# Patient Record
Sex: Male | Born: 1955
Health system: Southern US, Community
[De-identification: ages and names within clinical notes are randomized; demographics above are authoritative.]

## PROBLEM LIST (undated history)

## (undated) DIAGNOSIS — E119 Type 2 diabetes mellitus without complications: Secondary | ICD-10-CM

## (undated) DIAGNOSIS — I2510 Atherosclerotic heart disease of native coronary artery without angina pectoris: Secondary | ICD-10-CM

## (undated) DIAGNOSIS — I219 Acute myocardial infarction, unspecified: Secondary | ICD-10-CM

## (undated) HISTORY — PX: CARDIAC CATHETERIZATION: SHX172

---

## 2006-05-25 HISTORY — PX: CORONARY STENT INTERVENTION: CATH118234

## 2019-09-23 DIAGNOSIS — I4891 Unspecified atrial fibrillation: Secondary | ICD-10-CM

## 2019-09-23 HISTORY — DX: Unspecified atrial fibrillation: I48.91

## 2019-10-05 ENCOUNTER — Inpatient Hospital Stay (HOSPITAL_COMMUNITY)
Admission: EM | Admit: 2019-10-05 | Discharge: 2019-10-11 | DRG: 246 | Disposition: A | Discharge status: Home / Self Care | Payer: Managed Care, Other (non HMO) | Attending: Cardiology | Admitting: Cardiology

## 2019-10-05 ENCOUNTER — Inpatient Hospital Stay (HOSPITAL_COMMUNITY): Payer: Managed Care, Other (non HMO)

## 2019-10-05 ENCOUNTER — Encounter (HOSPITAL_COMMUNITY)
Admission: EM | Disposition: A | Discharge status: Home / Self Care | Payer: Self-pay | Source: Home / Self Care | Attending: Cardiology

## 2019-10-05 ENCOUNTER — Emergency Department (HOSPITAL_COMMUNITY): Payer: Managed Care, Other (non HMO)

## 2019-10-05 ENCOUNTER — Encounter (HOSPITAL_COMMUNITY): Payer: Self-pay | Admitting: *Deleted

## 2019-10-05 ENCOUNTER — Other Ambulatory Visit: Payer: Self-pay

## 2019-10-05 DIAGNOSIS — Z20822 Contact with and (suspected) exposure to covid-19: Secondary | ICD-10-CM | POA: Diagnosis present

## 2019-10-05 DIAGNOSIS — Z6837 Body mass index (BMI) 37.0-37.9, adult: Secondary | ICD-10-CM

## 2019-10-05 DIAGNOSIS — I4819 Other persistent atrial fibrillation: Secondary | ICD-10-CM | POA: Diagnosis present

## 2019-10-05 DIAGNOSIS — N183 Chronic kidney disease, stage 3 unspecified: Secondary | ICD-10-CM | POA: Diagnosis present

## 2019-10-05 DIAGNOSIS — J9601 Acute respiratory failure with hypoxia: Secondary | ICD-10-CM | POA: Diagnosis present

## 2019-10-05 DIAGNOSIS — N179 Acute kidney failure, unspecified: Secondary | ICD-10-CM | POA: Diagnosis present

## 2019-10-05 DIAGNOSIS — J810 Acute pulmonary edema: Secondary | ICD-10-CM | POA: Diagnosis not present

## 2019-10-05 DIAGNOSIS — I255 Ischemic cardiomyopathy: Secondary | ICD-10-CM | POA: Diagnosis present

## 2019-10-05 DIAGNOSIS — I4891 Unspecified atrial fibrillation: Secondary | ICD-10-CM

## 2019-10-05 DIAGNOSIS — I5043 Acute on chronic combined systolic (congestive) and diastolic (congestive) heart failure: Secondary | ICD-10-CM | POA: Diagnosis present

## 2019-10-05 DIAGNOSIS — E785 Hyperlipidemia, unspecified: Secondary | ICD-10-CM | POA: Diagnosis present

## 2019-10-05 DIAGNOSIS — I130 Hypertensive heart and chronic kidney disease with heart failure and stage 1 through stage 4 chronic kidney disease, or unspecified chronic kidney disease: Secondary | ICD-10-CM | POA: Diagnosis present

## 2019-10-05 DIAGNOSIS — E1122 Type 2 diabetes mellitus with diabetic chronic kidney disease: Secondary | ICD-10-CM | POA: Diagnosis present

## 2019-10-05 DIAGNOSIS — I959 Hypotension, unspecified: Secondary | ICD-10-CM | POA: Diagnosis not present

## 2019-10-05 DIAGNOSIS — Z7901 Long term (current) use of anticoagulants: Secondary | ICD-10-CM

## 2019-10-05 DIAGNOSIS — E8729 Other acidosis: Secondary | ICD-10-CM

## 2019-10-05 DIAGNOSIS — I2510 Atherosclerotic heart disease of native coronary artery without angina pectoris: Secondary | ICD-10-CM | POA: Diagnosis present

## 2019-10-05 DIAGNOSIS — T82855A Stenosis of coronary artery stent, initial encounter: Secondary | ICD-10-CM | POA: Diagnosis present

## 2019-10-05 DIAGNOSIS — I214 Non-ST elevation (NSTEMI) myocardial infarction: Secondary | ICD-10-CM | POA: Diagnosis not present

## 2019-10-05 DIAGNOSIS — I272 Pulmonary hypertension, unspecified: Secondary | ICD-10-CM | POA: Diagnosis present

## 2019-10-05 DIAGNOSIS — E669 Obesity, unspecified: Secondary | ICD-10-CM | POA: Diagnosis present

## 2019-10-05 DIAGNOSIS — I472 Ventricular tachycardia: Secondary | ICD-10-CM | POA: Diagnosis not present

## 2019-10-05 DIAGNOSIS — Z955 Presence of coronary angioplasty implant and graft: Secondary | ICD-10-CM

## 2019-10-05 DIAGNOSIS — E1169 Type 2 diabetes mellitus with other specified complication: Secondary | ICD-10-CM | POA: Diagnosis not present

## 2019-10-05 DIAGNOSIS — I10 Essential (primary) hypertension: Secondary | ICD-10-CM

## 2019-10-05 DIAGNOSIS — I5021 Acute systolic (congestive) heart failure: Secondary | ICD-10-CM | POA: Insufficient documentation

## 2019-10-05 DIAGNOSIS — I2119 ST elevation (STEMI) myocardial infarction involving other coronary artery of inferior wall: Secondary | ICD-10-CM | POA: Diagnosis not present

## 2019-10-05 DIAGNOSIS — I2582 Chronic total occlusion of coronary artery: Secondary | ICD-10-CM | POA: Diagnosis present

## 2019-10-05 DIAGNOSIS — I209 Angina pectoris, unspecified: Secondary | ICD-10-CM | POA: Diagnosis not present

## 2019-10-05 DIAGNOSIS — I509 Heart failure, unspecified: Secondary | ICD-10-CM

## 2019-10-05 DIAGNOSIS — J9602 Acute respiratory failure with hypercapnia: Secondary | ICD-10-CM

## 2019-10-05 HISTORY — DX: Atherosclerotic heart disease of native coronary artery without angina pectoris: I25.10

## 2019-10-05 HISTORY — DX: Type 2 diabetes mellitus without complications: E11.9

## 2019-10-05 LAB — CBC WITH DIFFERENTIAL/PLATELET
Abs Immature Granulocytes: 0.1 10*3/uL — ABNORMAL HIGH (ref 0.00–0.07)
Basophils Absolute: 0.1 10*3/uL (ref 0.0–0.1)
Basophils Relative: 1 %
Eosinophils Absolute: 0.2 10*3/uL (ref 0.0–0.5)
Eosinophils Relative: 2 %
HCT: 45.9 % (ref 39.0–52.0)
Hemoglobin: 14.9 g/dL (ref 13.0–17.0)
Immature Granulocytes: 1 %
Lymphocytes Relative: 16 %
Lymphs Abs: 2.4 10*3/uL (ref 0.7–4.0)
MCH: 30.2 pg (ref 26.0–34.0)
MCHC: 32.5 g/dL (ref 30.0–36.0)
MCV: 93.1 fL (ref 80.0–100.0)
Monocytes Absolute: 1.4 10*3/uL — ABNORMAL HIGH (ref 0.1–1.0)
Monocytes Relative: 9 %
Neutro Abs: 10.6 10*3/uL — ABNORMAL HIGH (ref 1.7–7.7)
Neutrophils Relative %: 71 %
Platelets: 226 10*3/uL (ref 150–400)
RBC: 4.93 MIL/uL (ref 4.22–5.81)
RDW: 14 % (ref 11.5–15.5)
WBC: 14.8 10*3/uL — ABNORMAL HIGH (ref 4.0–10.5)
nRBC: 0 % (ref 0.0–0.2)

## 2019-10-05 LAB — POCT I-STAT 7, (LYTES, BLD GAS, ICA,H+H)
Acid-base deficit: 2 mmol/L (ref 0.0–2.0)
Bicarbonate: 26.2 mmol/L (ref 20.0–28.0)
Calcium, Ion: 1.22 mmol/L (ref 1.15–1.40)
HCT: 51 % (ref 39.0–52.0)
Hemoglobin: 17.3 g/dL — ABNORMAL HIGH (ref 13.0–17.0)
O2 Saturation: 98 %
Patient temperature: 98.6
Potassium: 4.5 mmol/L (ref 3.5–5.1)
Sodium: 135 mmol/L (ref 135–145)
TCO2: 28 mmol/L (ref 22–32)
pCO2 arterial: 56.5 mmHg — ABNORMAL HIGH (ref 32.0–48.0)
pH, Arterial: 7.274 — ABNORMAL LOW (ref 7.350–7.450)
pO2, Arterial: 113 mmHg — ABNORMAL HIGH (ref 83.0–108.0)

## 2019-10-05 LAB — GLUCOSE, CAPILLARY
Glucose-Capillary: 140 mg/dL — ABNORMAL HIGH (ref 70–99)
Glucose-Capillary: 124 mg/dL — ABNORMAL HIGH (ref 70–99)
Glucose-Capillary: 154 mg/dL — ABNORMAL HIGH (ref 70–99)

## 2019-10-05 LAB — I-STAT CHEM 8, ED
BUN: 26 mg/dL — ABNORMAL HIGH (ref 8–23)
Calcium, Ion: 1.17 mmol/L (ref 1.15–1.40)
Chloride: 99 mmol/L (ref 98–111)
Creatinine, Ser: 1.1 mg/dL (ref 0.61–1.24)
Glucose, Bld: 206 mg/dL — ABNORMAL HIGH (ref 70–99)
HCT: 47 % (ref 39.0–52.0)
Hemoglobin: 16 g/dL (ref 13.0–17.0)
Potassium: 4.4 mmol/L (ref 3.5–5.1)
Sodium: 138 mmol/L (ref 135–145)
TCO2: 28 mmol/L (ref 22–32)

## 2019-10-05 LAB — COMPREHENSIVE METABOLIC PANEL
ALT: 99 U/L — ABNORMAL HIGH (ref 0–44)
ALT: 99 U/L — ABNORMAL HIGH (ref 0–44)
AST: 71 U/L — ABNORMAL HIGH (ref 15–41)
AST: 66 U/L — ABNORMAL HIGH (ref 15–41)
Albumin: 3.5 g/dL (ref 3.5–5.0)
Albumin: 3.9 g/dL (ref 3.5–5.0)
Alkaline Phosphatase: 73 U/L (ref 38–126)
Alkaline Phosphatase: 81 U/L (ref 38–126)
Anion gap: 11 (ref 5–15)
Anion gap: 16 — ABNORMAL HIGH (ref 5–15)
BUN: 23 mg/dL (ref 8–23)
BUN: 24 mg/dL — ABNORMAL HIGH (ref 8–23)
CO2: 27 mmol/L (ref 22–32)
CO2: 20 mmol/L — ABNORMAL LOW (ref 22–32)
Calcium: 8.9 mg/dL (ref 8.9–10.3)
Calcium: 8.6 mg/dL — ABNORMAL LOW (ref 8.9–10.3)
Chloride: 101 mmol/L (ref 98–111)
Chloride: 101 mmol/L (ref 98–111)
Creatinine, Ser: 1.36 mg/dL — ABNORMAL HIGH (ref 0.61–1.24)
Creatinine, Ser: 1.49 mg/dL — ABNORMAL HIGH (ref 0.61–1.24)
GFR calc Af Amer: >60 mL/min (ref >60)
GFR calc Af Amer: 57 mL/min — ABNORMAL LOW (ref >60)
GFR calc non Af Amer: 55 mL/min — ABNORMAL LOW (ref >60)
GFR calc non Af Amer: 49 mL/min — ABNORMAL LOW (ref >60)
Glucose, Bld: 203 mg/dL — ABNORMAL HIGH (ref 70–99)
Glucose, Bld: 227 mg/dL — ABNORMAL HIGH (ref 70–99)
Potassium: 5.6 mmol/L — ABNORMAL HIGH (ref 3.5–5.1)
Potassium: 4.6 mmol/L (ref 3.5–5.1)
Sodium: 139 mmol/L (ref 135–145)
Sodium: 137 mmol/L (ref 135–145)
Total Bilirubin: 2 mg/dL — ABNORMAL HIGH (ref 0.3–1.2)
Total Bilirubin: 1.6 mg/dL — ABNORMAL HIGH (ref 0.3–1.2)
Total Protein: 6.2 g/dL — ABNORMAL LOW (ref 6.5–8.1)
Total Protein: 6.6 g/dL (ref 6.5–8.1)

## 2019-10-05 LAB — MAGNESIUM: Magnesium: 1.9 mg/dL (ref 1.7–2.4)

## 2019-10-05 LAB — LIPID PANEL
Cholesterol: 101 mg/dL (ref 0–200)
HDL: 33 mg/dL — ABNORMAL LOW (ref >40)
LDL Cholesterol: 58 mg/dL (ref 0–99)
Total CHOL/HDL Ratio: 3.1 RATIO
Triglycerides: 49 mg/dL (ref <150)
VLDL: 10 mg/dL (ref 0–40)

## 2019-10-05 LAB — TSH: TSH: 4.307 u[IU]/mL (ref 0.350–4.500)

## 2019-10-05 LAB — TROPONIN I (HIGH SENSITIVITY)
Troponin I (High Sensitivity): 100 ng/L (ref <18)
Troponin I (High Sensitivity): 803 ng/L (ref <18)
Troponin I (High Sensitivity): 3888 ng/L (ref <18)
Troponin I (High Sensitivity): 24409 ng/L (ref <18)

## 2019-10-05 LAB — CBG MONITORING, ED: Glucose-Capillary: 193 mg/dL — ABNORMAL HIGH (ref 70–99)

## 2019-10-05 LAB — HEMOGLOBIN A1C
Hgb A1c MFr Bld: 7.6 % — ABNORMAL HIGH (ref 4.8–5.6)
Mean Plasma Glucose: 171.42 mg/dL

## 2019-10-05 LAB — APTT: aPTT: 49 seconds — ABNORMAL HIGH (ref 24–36)

## 2019-10-05 LAB — ECHOCARDIOGRAM COMPLETE: Weight: 4224 oz

## 2019-10-05 LAB — SARS CORONAVIRUS 2 BY RT PCR (HOSPITAL ORDER, PERFORMED IN ~~LOC~~ HOSPITAL LAB): SARS Coronavirus 2: NEGATIVE

## 2019-10-05 LAB — BRAIN NATRIURETIC PEPTIDE: B Natriuretic Peptide: 428.3 pg/mL — ABNORMAL HIGH (ref 0.0–100.0)

## 2019-10-05 LAB — PROTIME-INR
INR: 1.2 (ref 0.8–1.2)
Prothrombin Time: 14.7 seconds (ref 11.4–15.2)

## 2019-10-05 LAB — HEPARIN LEVEL (UNFRACTIONATED): Heparin Unfractionated: 0.82 IU/mL — ABNORMAL HIGH (ref 0.30–0.70)

## 2019-10-05 SURGERY — CORONARY/GRAFT ACUTE MI REVASCULARIZATION
Anesthesia: LOCAL

## 2019-10-05 MED ORDER — ACETAMINOPHEN 325 MG PO TABS: 650.0000 mg | ORAL_TABLET | ORAL | Status: DC | PRN

## 2019-10-05 MED ORDER — PERFLUTREN LIPID MICROSPHERE
1.0000 mL | INTRAVENOUS | Status: AC | PRN
  Administered 2019-10-05: 5 mL via INTRAVENOUS
  Filled 2019-10-05: qty 10

## 2019-10-05 MED ORDER — HEPARIN (PORCINE) 25000 UT/250ML-% IV SOLN
1650.0000 [IU]/h | INTRAVENOUS | Status: DC
  Administered 2019-10-05: 1350 [IU]/h via INTRAVENOUS
  Administered 2019-10-06: 1650 [IU]/h via INTRAVENOUS
  Filled 2019-10-05 (×2): qty 250

## 2019-10-05 MED ORDER — FUROSEMIDE 10 MG/ML IJ SOLN: 60.0000 mg | Freq: Once | INTRAMUSCULAR | Status: DC

## 2019-10-05 MED ORDER — INSULIN ASPART 100 UNIT/ML ~~LOC~~ SOLN: 0.0000 [IU] | Freq: Every day | SUBCUTANEOUS | Status: DC

## 2019-10-05 MED ORDER — SODIUM CHLORIDE 0.9% FLUSH: 3.0000 mL | INTRAVENOUS | Status: DC | PRN

## 2019-10-05 MED ORDER — ASPIRIN 81 MG PO CHEW
81.0000 mg | CHEWABLE_TABLET | ORAL | Status: AC
  Administered 2019-10-06: 81 mg via ORAL
  Filled 2019-10-05: qty 1

## 2019-10-05 MED ORDER — ONDANSETRON HCL 4 MG/2ML IJ SOLN: 4.0000 mg | Freq: Four times a day (QID) | INTRAMUSCULAR | Status: DC | PRN

## 2019-10-05 MED ORDER — LORAZEPAM 2 MG/ML IJ SOLN
0.5000 mg | Freq: Once | INTRAMUSCULAR | Status: AC
  Administered 2019-10-05: 0.5 mg via INTRAVENOUS
  Filled 2019-10-05: qty 1

## 2019-10-05 MED ORDER — METOPROLOL TARTRATE 5 MG/5ML IV SOLN
5.0000 mg | Freq: Once | INTRAVENOUS | Status: AC
  Administered 2019-10-05: 5 mg via INTRAVENOUS
  Filled 2019-10-05: qty 5

## 2019-10-05 MED ORDER — LORAZEPAM 2 MG/ML IJ SOLN
0.5000 mg | Freq: Once | INTRAMUSCULAR | Status: AC
  Administered 2019-10-05: 0.5 mg via INTRAVENOUS

## 2019-10-05 MED ORDER — TAMSULOSIN HCL 0.4 MG PO CAPS
0.4000 mg | ORAL_CAPSULE | Freq: Every day | ORAL | Status: DC
  Administered 2019-10-06 – 2019-10-11 (×6): 0.4 mg via ORAL
  Filled 2019-10-05 (×6): qty 1

## 2019-10-05 MED ORDER — LIDOCAINE HCL (PF) 1 % IJ SOLN
INTRAMUSCULAR | Status: AC
  Filled 2019-10-05: qty 30

## 2019-10-05 MED ORDER — PROPOFOL 10 MG/ML IV BOLUS
INTRAVENOUS | Status: AC | PRN
  Administered 2019-10-05: 30 mg via INTRAVENOUS

## 2019-10-05 MED ORDER — SODIUM CHLORIDE 0.9 % IV SOLN: INTRAVENOUS | Status: DC

## 2019-10-05 MED ORDER — NITROGLYCERIN 2 % TD OINT
1.0000 [in_us] | TOPICAL_OINTMENT | Freq: Once | TRANSDERMAL | Status: AC
  Administered 2019-10-05: 1 [in_us] via TOPICAL

## 2019-10-05 MED ORDER — METOPROLOL TARTRATE 25 MG PO TABS
25.0000 mg | ORAL_TABLET | Freq: Four times a day (QID) | ORAL | Status: DC
  Administered 2019-10-05 – 2019-10-06 (×3): 25 mg via ORAL
  Filled 2019-10-05 (×3): qty 1

## 2019-10-05 MED ORDER — SODIUM CHLORIDE 0.9 % IV SOLN: 250.0000 mL | INTRAVENOUS | Status: DC | PRN

## 2019-10-05 MED ORDER — METOPROLOL TARTRATE 25 MG PO TABS
25.0000 mg | ORAL_TABLET | Freq: Once | ORAL | Status: DC
  Filled 2019-10-05: qty 1

## 2019-10-05 MED ORDER — ATORVASTATIN CALCIUM 80 MG PO TABS
80.0000 mg | ORAL_TABLET | Freq: Every day | ORAL | Status: DC
  Administered 2019-10-06 – 2019-10-11 (×6): 80 mg via ORAL
  Filled 2019-10-05 (×6): qty 1

## 2019-10-05 MED ORDER — FUROSEMIDE 10 MG/ML IJ SOLN
40.0000 mg | Freq: Once | INTRAMUSCULAR | Status: AC
  Administered 2019-10-05: 40 mg via INTRAVENOUS
  Filled 2019-10-05: qty 4

## 2019-10-05 MED ORDER — INSULIN ASPART 100 UNIT/ML ~~LOC~~ SOLN
0.0000 [IU] | Freq: Three times a day (TID) | SUBCUTANEOUS | Status: DC
  Administered 2019-10-05 – 2019-10-06 (×2): 2 [IU] via SUBCUTANEOUS
  Administered 2019-10-06: 1 [IU] via SUBCUTANEOUS
  Administered 2019-10-07 – 2019-10-08 (×2): 3 [IU] via SUBCUTANEOUS
  Administered 2019-10-08 – 2019-10-10 (×4): 2 [IU] via SUBCUTANEOUS
  Administered 2019-10-10: 3 [IU] via SUBCUTANEOUS
  Administered 2019-10-11: 2 [IU] via SUBCUTANEOUS

## 2019-10-05 MED ORDER — ASPIRIN EC 81 MG PO TBEC
81.0000 mg | DELAYED_RELEASE_TABLET | Freq: Every day | ORAL | Status: DC
  Administered 2019-10-07 – 2019-10-11 (×4): 81 mg via ORAL
  Filled 2019-10-05 (×4): qty 1

## 2019-10-05 MED ORDER — LISINOPRIL 20 MG PO TABS: 40.0000 mg | ORAL_TABLET | Freq: Every day | ORAL | Status: DC

## 2019-10-05 MED ORDER — PROPOFOL 10 MG/ML IV BOLUS
INTRAVENOUS | Status: AC | PRN
  Administered 2019-10-05: 60 mg via INTRAVENOUS

## 2019-10-05 MED ORDER — FUROSEMIDE 10 MG/ML IJ SOLN
40.0000 mg | Freq: Once | INTRAMUSCULAR | Status: AC
  Administered 2019-10-05: 40 mg via INTRAVENOUS

## 2019-10-05 MED ORDER — CANAGLIFLOZIN 100 MG PO TABS
100.0000 mg | ORAL_TABLET | Freq: Every day | ORAL | Status: DC
  Administered 2019-10-06 – 2019-10-11 (×6): 100 mg via ORAL
  Filled 2019-10-05 (×7): qty 1

## 2019-10-05 MED ORDER — PROPOFOL 10 MG/ML IV BOLUS: 60.0000 mg | Freq: Once | INTRAVENOUS | Status: DC

## 2019-10-05 MED ORDER — PROPOFOL 10 MG/ML IV BOLUS
INTRAVENOUS | Status: AC
  Filled 2019-10-05: qty 20

## 2019-10-05 MED ORDER — SODIUM CHLORIDE 0.9% FLUSH
3.0000 mL | Freq: Two times a day (BID) | INTRAVENOUS | Status: DC
  Administered 2019-10-05 – 2019-10-09 (×5): 3 mL via INTRAVENOUS

## 2019-10-05 MED ORDER — ZOLPIDEM TARTRATE 5 MG PO TABS: 5.0000 mg | ORAL_TABLET | Freq: Every evening | ORAL | Status: DC | PRN

## 2019-10-05 MED ORDER — HEPARIN SODIUM (PORCINE) 1000 UNIT/ML IJ SOLN
INTRAMUSCULAR | Status: AC
  Filled 2019-10-05: qty 1

## 2019-10-05 MED ORDER — NITROGLYCERIN 1 MG/10 ML FOR IR/CATH LAB
INTRA_ARTERIAL | Status: AC
  Filled 2019-10-05: qty 10

## 2019-10-05 MED ORDER — SODIUM CHLORIDE 0.9% FLUSH: 3.0000 mL | Freq: Two times a day (BID) | INTRAVENOUS | Status: DC

## 2019-10-05 MED ORDER — HEPARIN (PORCINE) IN NACL 1000-0.9 UT/500ML-% IV SOLN
INTRAVENOUS | Status: AC
  Filled 2019-10-05: qty 1500

## 2019-10-05 MED ORDER — METOPROLOL SUCCINATE ER 25 MG PO TB24: 50.0000 mg | ORAL_TABLET | Freq: Every day | ORAL | Status: DC

## 2019-10-05 MED ORDER — PROPOFOL 10 MG/ML IV BOLUS: 0.5000 mg/kg | Freq: Once | INTRAVENOUS | Status: DC

## 2019-10-05 MED ORDER — APIXABAN 5 MG PO TABS
5.0000 mg | ORAL_TABLET | Freq: Two times a day (BID) | ORAL | Status: DC
  Filled 2019-10-05 (×2): qty 1

## 2019-10-05 MED ORDER — VERAPAMIL HCL 2.5 MG/ML IV SOLN
INTRAVENOUS | Status: AC
  Filled 2019-10-05: qty 2

## 2019-10-05 MED ORDER — LORAZEPAM 2 MG/ML IJ SOLN
INTRAMUSCULAR | Status: AC
  Filled 2019-10-05: qty 1

## 2019-10-05 MED ORDER — FUROSEMIDE 10 MG/ML IJ SOLN
INTRAMUSCULAR | Status: AC
  Filled 2019-10-05: qty 4

## 2019-10-05 MED ORDER — AMLODIPINE BESYLATE 5 MG PO TABS: 10.0000 mg | ORAL_TABLET | Freq: Every day | ORAL | Status: DC

## 2019-10-05 NOTE — Progress Notes (Signed)
During shift change patient complained of shortness of breath. Patient O2 88% on 6L, placed on 10L HFNC. PA paged. EKG and lasix ordered. Handed off to night RN

## 2019-10-05 NOTE — ED Notes (Signed)
Echo at the bedside °

## 2019-10-05 NOTE — Progress Notes (Addendum)
Progress Note  Patient Name: Carl Little Date of Encounter: 10/05/2019  Primary Cardiologist: Lenward Chancellor, MD  Subjective   No more CP since in SR, wants the BiPAP off  Inpatient Medications    Scheduled Meds: . apixaban  5 mg Oral BID  . aspirin EC  81 mg Oral Daily  . atorvastatin  80 mg Oral Daily  . canagliflozin  100 mg Oral QAC breakfast  . metoprolol tartrate  25 mg Oral Once  . metoprolol tartrate  25 mg Oral Q6H  . sodium chloride flush  3 mL Intravenous Q12H  . tamsulosin  0.4 mg Oral Daily   Continuous Infusions: . sodium chloride     PRN Meds: sodium chloride, acetaminophen, ondansetron (ZOFRAN) IV, perflutren lipid microspheres (DEFINITY) IV suspension, sodium chloride flush, zolpidem   Vital Signs    Vitals:   10/05/19 0700 10/05/19 0715 10/05/19 0730 10/05/19 0745  BP: (!) 142/127 (!) 125/94 122/90 117/80  Pulse: 97 95 (!) 101 95  Resp: 17 18 (!) 21 (!) 21  Temp:      TempSrc:      SpO2: 97% 97% 98% 96%  Weight:        Intake/Output Summary (Last 24 hours) at 10/05/2019 0826 Last data filed at 10/05/2019 0630 Gross per 24 hour  Intake --  Output 1750 ml  Net -1750 ml   Filed Weights   10/05/19 0114  Weight: 119.7 kg   Last Weight  Most recent update: 10/05/2019  1:14 AM   Weight  119.7 kg (264 lb)           Weight change:    Telemetry    SR, ST - Personally Reviewed  ECG    Most recent ECG is ST, HR 110, +artifact, but also w/ ?inf T wave changes - Personally Reviewed  Physical Exam   General: Well developed, well nourished, male, elevated resp rate on BiPAP Head: Normocephalic, atraumatic.  Neck: Supple without bruits, JVD elevated but diff to assess 2nd BiPAP. Lungs:  Resp regular and unlabored, rales bases. Heart: RRR, S1, S2, no S3, S4, or murmur; no rub. Abdomen: Soft, non-tender, non-distended with normoactive bowel sounds. No hepatomegaly. No rebound/guarding. No obvious abdominal masses. Extremities: No  clubbing, cyanosis, 1+ edema. Distal pedal pulses are 2+ bilaterally. Neuro: Alert and oriented X 3. Moves all extremities spontaneously. Psych: Normal affect.  Labs    Hematology Recent Labs  Lab 10/05/19 0108 10/05/19 0143 10/05/19 0254  WBC 14.8*  --   --   RBC 4.93  --   --   HGB 14.9 16.0 17.3*  HCT 45.9 47.0 51.0  MCV 93.1  --   --   MCH 30.2  --   --   MCHC 32.5  --   --   RDW 14.0  --   --   PLT 226  --   --     Chemistry Recent Labs  Lab 10/05/19 0108 10/05/19 0108 10/05/19 0143 10/05/19 0244 10/05/19 0254  NA 139   < > 138 137 135  K 5.6*   < > 4.4 4.6 4.5  CL 101  --  99 101  --   CO2 27  --   --  20*  --   GLUCOSE 203*  --  206* 227*  --   BUN 23  --  26* 24*  --   CREATININE 1.36*  --  1.10 1.49*  --   CALCIUM 8.9  --   --  8.6*  --   PROT 6.2*  --   --  6.6  --   ALBUMIN 3.5  --   --  3.9  --   AST 71*  --   --  66*  --   ALT 99*  --   --  99*  --   ALKPHOS 73  --   --  81  --   BILITOT 2.0*  --   --  1.6*  --   GFRNONAA 55*  --   --  49*  --   GFRAA >60  --   --  57*  --   ANIONGAP 11  --   --  16*  --    < > = values in this interval not displayed.     High Sensitivity Troponin:   Recent Labs  Lab 10/05/19 0108 10/05/19 0244 10/05/19 0541  TROPONINIHS 100* 803* 3,888*      BNP Recent Labs  Lab 10/05/19 0244  BNP 428.3*    Lab Results  Component Value Date   CHOL 101 10/05/2019   HDL 33 (L) 10/05/2019   LDLCALC 58 10/05/2019   TRIG 49 10/05/2019   CHOLHDL 3.1 10/05/2019   Lab Results  Component Value Date   TSH 4.307 10/05/2019   Lab Results  Component Value Date   HGBA1C 7.6 (H) 10/05/2019     Radiology    DG Chest Portable 1 View  Result Date: 10/05/2019 CLINICAL DATA:  Shortness of breath EXAM: PORTABLE CHEST 1 VIEW COMPARISON:  Oct 05, 2019, 1:32 a.m. FINDINGS: Again noted is cardiomegaly. There is prominence of the central pulmonary vasculature. There is new mildly increased interstitial opacity seen within  both lower lungs. No large airspace consolidation or pleural effusion. No acute osseous abnormality. IMPRESSION: Cardiomegaly and slight interval worsening in interstitial edema Electronically Signed   By: Jonna Clark M.D.   On: 10/05/2019 03:01   DG Chest Portable 1 View  Result Date: 10/05/2019 CLINICAL DATA:  Chest pain EXAM: PORTABLE CHEST 1 VIEW COMPARISON:  None. FINDINGS: Moderate cardiomegaly. Pulmonary vascular congestion without overt edema. No pleural effusion or pneumothorax. No focal airspace consolidation. IMPRESSION: Cardiomegaly and pulmonary vascular congestion without overt edema. Electronically Signed   By: Deatra Robinson M.D.   On: 10/05/2019 01:46     Cardiac Studies   ECHO:  Results pending, EF is decreased  Patient Profile     64 y.o. male w/ hx HTN, DM2, DES x 3 RCA & DES x 2 CFX 2008, abnl MV 10/2018 in [place] (med rx), persistent Afib on Eliquis, nl EF on echo 06/2018, was admitted 05/13 w/ CP, CHF & Afib RVR>>DCCV x 2>>SR.  Assessment & Plan    1. NSTEMI - Ez are elevated, pt had CP - EF prev nl, reported as decreased on echo, final report pending - CP has resolved - continue nitro paste, add heparin - continue ASA, high-dose statin, BB - consider cath once resp status improved  2. CHF, likely acute systolic and ?diastolic - BiPAP for now, continue diuresis w/ Lasix 40 mg IV BID - follow renal function w/ diuresis  3. Afib RVR - s/p DCCV x 2 >>SR, ST now - was on Toprol XL 50 mg qd pta, now on Lopressor 25 mg q 6 hr - SBP 110s now, follow on increased BB - CHA2DS2-VASc = 4 (CHF, CAD, DM, HTN) - Eliquis on hold for possible cath in am, heparin ordered   Active Problems:   Atrial fibrillation  with rapid ventricular response ([place])   Acute heart failure ([place])    Signed, Rosaria Ferries , PA-C 8:26 AM 10/05/2019 Pager: [phone]  As above, patient seen and examined.  Patient admitted with atrial fibrillation with rapid ventricular response and  chest pain.  Also with acute dyspnea requiring BiPAP.  He underwent emergent cardioversion.  He was also diuresed.  His symptoms have improved and at present he denies chest pain or dyspnea.  He remains in sinus rhythm.  Follow-up electrocardiogram shows sinus tachycardia and nonspecific ST changes.  Troponin has increased to 24,409.  Creatinine 1.49.   1 non-ST elevation myocardial infarction-plan to treat with aspirin, heparin, beta-blocker and statin.  Patient will require cardiac catheterization.  The risks and benefits including myocardial infarction, CVA and death discussed and he agrees to proceed.  We will plan tomorrow morning.  Apixaban has been placed on hold.  2 paroxysmal atrial fibrillation-patient is status post emergent cardioversion.  Discontinue apixaban given need for catheterization.  We will treat with IV heparin in the interim and resume apixaban once all procedures complete.  We will treat with metoprolol.  Check echocardiogram.  3 acute combined systolic/diastolic congestive heart failure-patient was volume overloaded at time of admission but has improved following emergent cardioversion.  Will hold on further diuresis in anticipation of cardiac catheterization.  4 acute kidney injury-creatinine mildly increased.  Will hold on further diuresis.  Follow creatinine.  Given presentation of congestive heart failure will hold on hydrating prior to catheterization.  5 diabetes mellitus-follow CBGs.  30 min CCT  Kirk Ruths, MD

## 2019-10-05 NOTE — ED Provider Notes (Signed)
CHIEF COMPLAINT: Chest pain, shortness of breath  HPI: 64 year old male with history of CAD, hypertension, diabetes, hyperlipidemia, obesity, atrial fibrillation on Eliquis who presents to the emergency department with complaints of intermittent sharp central chest pain without radiation for the past 2 days.  States symptoms worsened tonight and he became more short of breath.  Shortness of breath worse with lying flat.  He denies any known history of CHF and it does not appear he is on a diuretic.  He is not sure if he was told he had atrial fibrillation or not but states he did have a fast heart rate once and that is why he is on Eliquis.  No history of PE or DVT.  Does have bilateral lower extremity swelling.  States he is here from [place] for work.  No fevers or cough.  Took 324 mg of aspirin at home prior to calling EMS.  Given 1 nitroglycerin tablet with EMS.  EKG is noted to show A. fib with RVR with mild anterior ST elevation likely rate related. Code STEMI initiated in route by EMS.  Not on oxygen chronically.  Placed on oxygen by EMS.  ROS: See HPI Constitutional: no fever  Eyes: no drainage  ENT: no runny nose   Cardiovascular:   chest pain  Resp:  SOB  GI: no vomiting GU: no dysuria Integumentary: no rash  Allergy: no hives  Musculoskeletal:  leg swelling  Neurological: no slurred speech ROS otherwise negative  PAST MEDICAL HISTORY/PAST SURGICAL HISTORY:  Past Medical History:  Diagnosis Date  . CAD (coronary artery disease)   . Diabetes mellitus without complication (HCC)     MEDICATIONS:  Prior to Admission medications   Not on File    ALLERGIES:  No Known Allergies  SOCIAL HISTORY:  Social History   Tobacco Use  . Smoking status: Never Smoker  Substance Use Topics  . Alcohol use: Not on file    FAMILY HISTORY: No family history on file.  EXAM: BP 120/80   Pulse 95   Temp 98.3 F (36.8 C) (Oral)   Resp 19   Wt 119.7 kg   SpO2 96%  CONSTITUTIONAL:  Alert and oriented and responds appropriately to questions.  Obese HEAD: Normocephalic EYES: Conjunctivae clear, pupils appear equal, EOM appear intact ENT: normal nose; moist mucous membranes NECK: Supple, normal ROM CARD: Irregularly irregular and tachycardic; S1 and S2 appreciated; no murmurs, no clicks, no rubs, no gallops RESP: Patient is tachypneic here.  He has hypoxic on room air.  Diminished at bases bilaterally but I do not appreciate rales, rhonchi or wheezing.  He is able to speak full sentences. ABD/GI: Normal bowel sounds; non-distended; soft, non-tender, no rebound, no guarding, no peritoneal signs, no hepatosplenomegaly BACK:  The back appears normal EXT: Normal ROM in all joints; no deformity noted, +1 edema in bilateral lower extremities, no calf tenderness or swelling; no cyanosis SKIN: Normal color for age and race; warm; no rash on exposed skin NEURO: Moves all extremities equally PSYCH: The patient's mood and manner are appropriate.   MEDICAL DECISION MAKING: Patient here with A. fib with RVR.  Suspect this is causing CHF exacerbation.  He appears volume overloaded and is hypoxic here.  EKG reviewed by myself and cardiologist Dr. Blossom Hoops and Dr. Ellyn Hack that are both at bedside.  We agree that patient needs emergent cardioversion given his tachycardia, hypoxia and his symptoms.  Risk and benefits discussed with patient.  He gave verbal permission to proceed with propofol  sedation and electrocardioversion.  He reports compliance with his Eliquis twice daily over the past 30 days.  ED PROGRESS: Cardioversion performed using propofol sedation.  Patient had brief episode of hypoxia that improved with BVM ventilation and airway repositioning.  He was successfully converted into a sinus tachycardia after 2 synchronized shocks using 200 J.  EKG obtained that does not show ST elevation consistent with STEMI.  Code STEMI canceled per Dr. Herbie Baltimore at bedside.  Chest x-ray reviewed and shows  signs of pulmonary edema.  Lasix ordered.  Patient will be admitted by cardiology service.   I was called emergently to the bedside as patient started having respiratory distress.  Nurse reports they were trying to transition him off of nonrebreather onto nasal cannula when he became hypoxic, tachypneic and had a wet cough.  Repeat EKG showed no significant change.  Patient still in sinus tachycardia.  Given another 40 mg of IV Lasix per cardiology recommendations.  An inch of Nitropaste was placed on patient's chest.  Placed on BiPAP and patient seemed to improve.  Also given 0.5 mg of Ativan for anxiety and cough.  ABG obtained that showed mild respiratory acidosis.  Cardiology at bedside performing bedside cardiac ultrasound.  No significant wall motion abnormalities noted or pericardial effusion.  Patient seemed to improve on BiPAP.  Repeat chest x-ray obtained that showed slightly worsening edema.   Patient resting comfortably still on BiPAP.  Awaiting stepdown bed.  Patient's troponins are elevated but this is in the setting of CHF exacerbation and A. fib with RVR.   .Cardioversion  Date/Time: 10/05/2019 1:10 AM Performed by: Davinia Riccardi, Layla Maw, DO Authorized by: Dejanay Wamboldt, Layla Maw, DO   Consent:    Consent obtained:  Verbal   Consent given by:  Patient   Risks discussed:  Cutaneous burn, death, induced arrhythmia and pain   Alternatives discussed:  Rate-control medication and delayed treatment Pre-procedure details:    Cardioversion basis:  Emergent   Rhythm:  Atrial fibrillation   Electrode placement:  Anterior-posterior Patient sedated: Yes. Refer to sedation procedure documentation for details of sedation.  Attempt one:    Cardioversion mode:  Synchronous   Waveform:  Biphasic   Shock (Joules):  200   Shock outcome:  No change in rhythm Attempt two:    Cardioversion mode:  Synchronous   Waveform:  Biphasic   Shock (Joules):  200   Shock outcome:  Conversion to normal sinus  rhythm Post-procedure details:    Patient status:  Awake   Patient tolerance of procedure:  Tolerated well, no immediate complications  .Sedation  Date/Time: 10/05/2019 1:10 AM Performed by: Durant Scibilia, Layla Maw, DO Authorized by: Karol Liendo, Layla Maw, DO   Consent:    Consent obtained:  Verbal and emergent situation   Consent given by:  Patient   Risks discussed:  Allergic reaction, dysrhythmia, inadequate sedation, nausea, prolonged hypoxia resulting in organ damage, prolonged sedation necessitating reversal, respiratory compromise necessitating ventilatory assistance and intubation and vomiting   Alternatives discussed:  Analgesia without sedation, anxiolysis and regional anesthesia Universal protocol:    Procedure explained and questions answered to patient or proxy's satisfaction: yes     Relevant documents present and verified: yes     Test results available and properly labeled: yes     Imaging studies available: yes     Required blood products, implants, devices, and special equipment available: yes     Site/side marked: yes     Immediately prior to procedure a time out was  called: yes     Patient identity confirmation method:  Verbally with patient Indications:    Procedure performed:  Cardioversion   Procedure necessitating sedation performed by:  Physician performing sedation Pre-sedation assessment:    Time since last food or drink:  8pm   ASA classification: class 3 - patient with severe systemic disease     Neck mobility: normal     Mouth opening:  2 finger widths   Thyromental distance:  3 finger widths   Mallampati score:  I - soft palate, uvula, fauces, pillars visible   Pre-sedation assessments completed and reviewed: airway patency, cardiovascular function, hydration status, mental status, nausea/vomiting, pain level, respiratory function and temperature     Pre-sedation assessment completed:  10/05/2019 1:05 AM Immediate pre-procedure details:    Reassessment: Patient  reassessed immediately prior to procedure     Reviewed: vital signs, relevant labs/tests and NPO status     Verified: bag valve mask available, emergency equipment available, intubation equipment available, IV patency confirmed, oxygen available and suction available   Procedure details (see MAR for exact dosages):    Preoxygenation:  Nasal cannula   Sedation:  Propofol   Intended level of sedation: deep   Intra-procedure monitoring:  Blood pressure monitoring, cardiac monitor, continuous pulse oximetry, frequent LOC assessments, frequent vital sign checks and continuous capnometry   Intra-procedure events: hypoxia and respiratory depression     Intra-procedure management:  Airway repositioning and BVM ventilation   Total Provider sedation time (minutes):  30 Post-procedure details:    Post-sedation assessment completed:  10/05/2019 1:45 AM   Attendance: Constant attendance by certified staff until patient recovered     Recovery: Patient returned to pre-procedure baseline     Post-sedation assessments completed and reviewed: airway patency, cardiovascular function, hydration status, mental status, nausea/vomiting, pain level, respiratory function and temperature     Patient is stable for discharge or admission: yes     Patient tolerance:  Tolerated well, no immediate complications    CRITICAL CARE Performed by: Rochele Raring   Total critical care time: 65 minutes  Critical care time was exclusive of separately billable procedures and treating other patients.  Critical care was necessary to treat or prevent imminent or life-threatening deterioration.  Critical care was time spent personally by me on the following activities: development of treatment plan with patient and/or surrogate as well as nursing, discussions with consultants, evaluation of patient's response to treatment, examination of patient, obtaining history from patient or surrogate, ordering and performing treatments and  interventions, ordering and review of laboratory studies, ordering and review of radiographic studies, pulse oximetry and re-evaluation of patient's condition.    EKG Interpretation  Date/Time:  Thursday Oct 05 2019 01:07:12 EDT Ventricular Rate:  153 PR Interval:    QRS Duration: 109 QT Interval:  243 QTC Calculation: 388 R Axis:   73 Text Interpretation: Atrial fibrillation with RVR No old tracing to compare Confirmed by Helvi Royals, Baxter Hire [phone]) on 10/05/2019 5:02:29 AM       EKG Interpretation  Date/Time:  Thursday Oct 05 2019 01:23:41 EDT Ventricular Rate:  100 PR Interval:    QRS Duration: 101 QT Interval:  335 QTC Calculation: 432 R Axis:   66 Text Interpretation: Sinus tachycardia Consider left atrial enlargement Probable anterior infarct, old Baseline wander in lead(s) V2 A fib has resolved Confirmed by Rochele Raring [phone]) on 10/05/2019 5:02:53 AM        EKG Interpretation  Date/Time:  Thursday Oct 05 2019 01:33:41 EDT Ventricular  Rate:  98 PR Interval:    QRS Duration: 97 QT Interval:  355 QTC Calculation: 454 R Axis:   68 Text Interpretation: Sinus tachycardia Atrial premature complex Probable anterior infarct, old Borderline repolarization abnormality Confirmed by Paulena Servais, Baxter Hire [phone]) on 10/05/2019 5:03:25 AM       EKG Interpretation  Date/Time:  Thursday Oct 05 2019 02:26:32 EDT Ventricular Rate:  116 PR Interval:    QRS Duration: 120 QT Interval:  334 QTC Calculation: 464 R Axis:   77 Text Interpretation: Sinus tachycardia Atrial premature complex LAE, consider biatrial enlargement Nonspecific intraventricular conduction delay Anteroseptal infarct, old Nonspecific T abnormalities, inferior leads Baseline wander in lead(s) II aVF V3 Confirmed by Rochele Raring [phone]) on 10/05/2019 5:03:47 AM        EKG Interpretation  Date/Time:  Thursday Oct 05 2019 02:45:31 EDT Ventricular Rate:  110 PR Interval:    QRS Duration: 94 QT Interval:  343 QTC  Calculation: 464 R Axis:   70 Text Interpretation: Sinus tachycardia Probable anterior infarct, old Nonspecific T abnormalities, inferior leads Confirmed by Rochele Raring [phone]) on 10/05/2019 5:04:08 AM        Carl Little was evaluated in Emergency Department on 10/05/2019 for the symptoms described in the history of present illness. He was evaluated in the context of the global COVID-19 pandemic, which necessitated consideration that the patient might be at risk for infection with the SARS-CoV-2 virus that causes COVID-19. Institutional protocols and algorithms that pertain to the evaluation of patients at risk for COVID-19 are in a state of rapid change based on information released by regulatory bodies including the CDC and federal and state organizations. These policies and algorithms were followed during the patient's care in the ED.      Carl Little, Layla Maw, DO 10/05/19 [phone]

## 2019-10-05 NOTE — Progress Notes (Signed)
   10/05/19 2018  Assess: MEWS Score  Temp (!) 97.5 F (36.4 C)  BP 128/82  Pulse Rate (!) 107  Resp (!) 24  SpO2 95 %  O2 Device Bi-PAP  FiO2 (%) 70 %  Assess: MEWS Score  MEWS Temp 0  MEWS Systolic 0  MEWS Pulse 1  MEWS RR 1  MEWS LOC 0  MEWS Score 2  MEWS Score Color Yellow  Assess: if the MEWS score is Yellow or Red  Were vital signs taken at a resting state? Yes  Focused Assessment Documented focused assessment  Early Detection of Sepsis Score *See Row Information* Medium  MEWS guidelines implemented *See Row Information* Yes  Treat  MEWS Interventions Escalated (See documentation below)  Take Vital Signs  Increase Vital Sign Frequency  Yellow: Q 2hr X 2 then Q 4hr X 2, if remains yellow, continue Q 4hrs  Escalate  MEWS: Escalate Yellow: discuss with charge nurse/RN and consider discussing with provider and RRT  Notify: Charge Nurse/RN  Name of Charge Nurse/RN Notified Shanell RN  Date Charge Nurse/RN Notified 10/05/19  Time Charge Nurse/RN Notified 2018  Notify: Provider  Provider Name/Title Judy Pimple PA  Date Provider Notified 10/05/19  Time Provider Notified 2018  Notification Type Page  Notification Reason Other (Comment) (MEWS TURNED YELLOW)  Response See new orders  Date of Provider Response 10/05/19  Time of Provider Response 2018     10/05/19 2018  Assess: MEWS Score  Temp (!) 97.5 F (36.4 C)  BP 128/82  Pulse Rate (!) 107  Resp (!) 24  SpO2 95 %  O2 Device Bi-PAP  FiO2 (%) 70 %  Assess: MEWS Score  MEWS Temp 0  MEWS Systolic 0  MEWS Pulse 1  MEWS RR 1  MEWS LOC 0  MEWS Score 2  MEWS Score Color Yellow  Assess: if the MEWS score is Yellow or Red  Were vital signs taken at a resting state? Yes  Focused Assessment Documented focused assessment  Early Detection of Sepsis Score *See Row Information* Medium  MEWS guidelines implemented *See Row Information* Yes  Treat  MEWS Interventions Escalated (See documentation below)  Take  Vital Signs  Increase Vital Sign Frequency  Yellow: Q 2hr X 2 then Q 4hr X 2, if remains yellow, continue Q 4hrs  Escalate  MEWS: Escalate Yellow: discuss with charge nurse/RN and consider discussing with provider and RRT  Notify: Charge Nurse/RN  Name of Charge Nurse/RN Notified Shanell RN  Date Charge Nurse/RN Notified 10/05/19  Time Charge Nurse/RN Notified 2018  Notify: Provider  Provider Name/Title Judy Pimple PA  Date Provider Notified 10/05/19  Time Provider Notified 2018  Notification Type Page  Notification Reason Other (Comment) (MEWS TURNED YELLOW)  Response See new orders  Date of Provider Response 10/05/19  Time of Provider Response 2018

## 2019-10-05 NOTE — ED Notes (Addendum)
Cards at bedside; pt tolerating bipap

## 2019-10-05 NOTE — ED Notes (Signed)
SDU  bfast ordered  

## 2019-10-05 NOTE — Progress Notes (Addendum)
ANTICOAGULATION CONSULT NOTE - Follow Up Consult  Pharmacy Consult for heparin Indication: chest pain/ACS and atrial fibrillation  No Known Allergies  Patient Measurements: Height: 5' 7.5" (171.5 cm) Weight: 119.7 kg (263 lb 14.3 oz) IBW/kg (Calculated) : 67.25 Heparin Dosing Weight: 94.8 kg  Vital Signs: Temp: 97.5 F (36.4 C) (05/13 2018) Temp Source: Axillary (05/13 2018) BP: 128/82 (05/13 2018) Pulse Rate: 107 (05/13 2018)  Labs: Recent Labs    10/05/19 0108 10/05/19 0108 10/05/19 0136 10/05/19 0143 10/05/19 0244 10/05/19 0254 10/05/19 0541 10/05/19 0729 10/05/19 1828  HGB 14.9   < >  --  16.0  --  17.3*  --   --   --   HCT 45.9  --   --  47.0  --  51.0  --   --   --   PLT 226  --   --   --   --   --   --   --   --   APTT  --   --   --   --   --   --   --   --  49*  LABPROT  --   --  14.7  --   --   --   --   --   --   INR  --   --  1.2  --   --   --   --   --   --   HEPARINUNFRC  --   --   --   --   --   --   --   --  0.82*  CREATININE 1.36*  --   --  1.10 1.49*  --   --   --   --   TROPONINIHS 100*   < >  --   --  803*  --  3,888* 24,409*  --    < > = values in this interval not displayed.    Estimated Creatinine Clearance: 62.6 mL/min (A) (by C-G formula based on SCr of 1.49 mg/dL (H)).   Medical History: Past Medical History:  Diagnosis Date  . Atrial fibrillation (HCC) 09/2019  . CAD (coronary artery disease)   . Diabetes mellitus without complication [facility])     Assessment: 64 yr old male presented to the ED with CP and elevated troponin (NSTEMI). He is on chronic apixaban for history of afib (last dose was yesterday, 10/04/19).  Pt was transitioned to IV heparin for possible cardiac cath. H/H 17.3/51.0, platelets 226. Because pt was recently on apixaban, we will monitor anticoagulation using aPTT until aPTT and heparin levels correlate.  aPTT and heparin level ~8 hrs after starting heparin infusion at 1350 units/hr were 49 sec and 0.82 units/ml,  respectively (not correlated at this point). aPTT is below the goal range for this pt; apixaban is still having effect on heparin level. Per RN, no issues with IV or bleeding observed. Pt scheduled for cardiac cath tomorrow.  Goal of Therapy:  Heparin level 0.3-0.7 units/ml aPTT 66-103 seconds Monitor platelets by anticoagulation protocol: Yes   Plan:  Increase heparin infusion to 1650 units/hr Check 6-hr heparin level and aPTT Monitor daily heparin level, aPTT, CBC Monitor for signs/symptoms of bleeding F/U plans after cardiac cath  Vicki Mallet, PharmD, BCPS, [facility] Clinical Pharmacist 10/05/2019,9:21 PM

## 2019-10-05 NOTE — ED Notes (Signed)
Pt taken off of bipap at this time per his request. Placed pt on 6L [place]. Respirations equal and unlabored. Oxygen saturations maintaining 94% or better. Will cont to monitor.

## 2019-10-05 NOTE — Progress Notes (Signed)
   Notified by RN that patient reporting increased SOB. He reports SOB started about 10 minutes prior to my evaluation. Feels similar to the SOB he experienced prior to onset of chest pain yesterday. No chest pain at this time. He feels more comfortable when he stands up. He was weaned off BiPAP this morning.   Currently on HF [place] at 10L with O2 sats at 93%. HR is elevated to the 100s-110s, though appears to be sinus. He has increased work of breathing on exam with mildly diminished breath sounds at lung bases and 1+ LE edema.   He receive 80mg  IV lasix this morning with plans to hold off on further diuresis today in anticipation of LHC tomorrow, however with increased WOB, will give an additional lasix 40mg  IV at this time. Will check EKG to confirm no dynamic changes. Continue heparin gtt and nitro patch for NSTEMI with plans for [facility] tomorrow. Low threshold to resume BiPAP if unable to maintain O2 sats in the 90s.   , PA-C 10/05/19; 7:37 PM

## 2019-10-05 NOTE — ED Triage Notes (Addendum)
Pt having intermittent CP for the past few days; described as stabbing pain with Sob that worsens when lying flat. Hx of MI and 5 stents placed in 2008,. Pt is on Eliquis.STEMI activated enroute. Pt took 324mg  ASA and has had nitrox 1 with improvement of pain. AFib RVR noted, rate of 140; EMS placed pt on 4L [place]

## 2019-10-05 NOTE — Progress Notes (Signed)
Echocardiogram 2D Echocardiogram has been performed.  Carl Little 10/05/2019, 8:50 AM

## 2019-10-05 NOTE — ED Notes (Signed)
Upon this RN providing update to patient regarding his wait for inpatient bed, patient states "I dont really think I need to be here. " this RN discussed with patient why he is being admitted, pt verbalized understanding.

## 2019-10-05 NOTE — H&P (Addendum)
Cardiology History & Physical    Patient ID: Carl Little MRN: [id number], DOB/AGE: 1955-06-07   Admit date: 10/05/2019  Primary Physician: No primary care provider on file. Primary Cardiologist: No primary care provider on file. - Follows w/ cardiologist in [place]  Patient Profile    64 year old male with history of hypertension, T2DM, obesity, stable coronary artery disease s/p PCI of RCA (3 DES), LCx (2 DES) in 2008, and persistent atrial fibrillation on apixban who was activated as a STEMI after developing acute shortness of breath and mild substernal chest pain at home found to be in acute heart failure with AF w/ RVR at presentation.  History of Present Illness    64 year old male with history of hypertension, T2DM, obesity, stable coronary artery disease s/p PCI of RCA (3 DES), LCx (2 DES) in 2008, and persistent atrial fibrillation on apixban who was activated as a STEMI after developing acute shortness of breath and mild substernal chest pain at home.  En route with EMS there were concerns for inferior dynamic ST elevations.  On arrival to ED he was complaining of 2/10 chest pain with more significant shortness of breath.  He was found to be in atrial fibrillation with rapid ventricular response and chest x-ray demonstrated bilateral pulmonary edema.  He endorsed complete adherence to his home apixban.  He was sedated with assistance of ED attending and DCCV was attempted with persistent AF w/ RVR.  A second DCCV was performed with conversion to normal sinus rhythm.  He required brief bag mask ventilation following this but was able to recover to non-rebreather.  Repeat EKG after conversion to sinus rhythm with improved ventricular rate demonstrated resolving mild ST elevation in inferior leads without ST elevation.  He reports subacute worsening of shortness of breath over the last few days, increasing LE edema, but no significant chest pain.  He laid down tonight and felt markedly  dyspneic.  Denies known prior history of heart failure and does not take diuretics.  He has not been using sympathomimetics.  While he has been on anticoagulation for at least a year and has discussed his AF with his cardiologist, he could not confirm a diagnosis of atrial fibrillation.  Past Medical History   Past Medical History:  Diagnosis Date  . CAD (coronary artery disease)   . Diabetes mellitus without complication [facility])     Past Surgical History:  Procedure Laterality Date  . CARDIAC CATHETERIZATION       Allergies No Known Allergies  Home Medications    Prior to Admission medications   Medication Sig Start Date End Date Taking? Authorizing Provider  amLODipine (NORVASC) 10 MG tablet Take 10 mg by mouth daily. 07/28/19  Yes [provider]  apixaban (ELIQUIS) 5 MG TABS tablet Take 5 mg by mouth in the morning and at bedtime. 07/28/19  Yes [provider]  aspirin 81 MG EC tablet Take 81 mg by mouth daily. 03/29/14  Yes [provider]  atorvastatin (LIPITOR) 80 MG tablet Take 80 mg by mouth every evening.  07/28/19  Yes [provider]  dapagliflozin propanediol (FARXIGA) 5 MG TABS tablet Take 5 mg by mouth daily. 07/28/19  Yes [provider]  Dulaglutide (TRULICITY) 0.75 MG/0.5ML SOPN Inject 0.75 mg into the skin every Monday.  07/28/19  Yes [provider]  lisinopril (ZESTRIL) 40 MG tablet Take 40 mg by mouth daily. 07/28/19  Yes [provider]  metoprolol succinate (TOPROL-XL) 50 MG 24 hr  tablet Take 50 mg by mouth daily. 07/28/19  Yes [provider]  tamsulosin (FLOMAX) 0.4 MG CAPS capsule Take 0.4 mg by mouth daily. 07/28/19  Yes [provider]    Family History    Multiple family members with atherosclerotic heart disease, PAD, none at premature ages.  Social History    Never smoker.  Drinks ~ 6 pack per week, sometimes more.  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  No drug use.  Calls [place] home when he's there occasionally.  Review of Systems    General:  No chills, fever, night sweats or weight changes.  Cardiovascular: chest pain, dyspnea on exertion, edema, orthopnea, palpitations, paroxysmal nocturnal dyspnea. Dermatological: No rash, lesions/masses Respiratory: No cough, dyspnea Urologic: No hematuria, dysuria Abdominal:   No nausea, vomiting, diarrhea, bright red blood per rectum, melena, or hematemesis Neurologic:  No visual changes, wkns, changes in mental status. All other systems reviewed and are otherwise negative except as noted above.  Physical Exam    BP (!) 143/100   Pulse 100   Temp 98.3 F (36.8 C) (Oral)   Resp (!) 21   Wt 119.7 kg   SpO2 96%  General: Alert, NAD HEENT: Normal  Neck: JVP estimated 12 cm H20 Lungs:  Wheezes, lower lung files bilaterally Heart: Regular rhythm, no s3, s4, or murmurs. Abdomen: Soft, non-tender, non-distended, BS +.  Extremities: Warm extremities w/ pitting edema bilaterally. DP/PT/Radials 2+ and equal bilaterally. Psych: Normal affect. Neuro: Alert and oriented. No gross focal deficits. No abnormal movements.  Labs    Pending  iStat w/ Cr 1.10, K 4.4, normal sodium, Glucose 206  Radiology Studies    DG Chest Portable 1 View  Result Date: 10/05/2019 CLINICAL DATA:  Chest pain EXAM: PORTABLE CHEST 1 VIEW COMPARISON:  None. FINDINGS: Moderate cardiomegaly. Pulmonary vascular congestion without overt edema. No pleural effusion or pneumothorax. No focal airspace consolidation. IMPRESSION: Cardiomegaly and pulmonary vascular congestion without overt edema. Electronically Signed   By: Deatra Robinson M.D.   On: 10/05/2019 01:46    ECG & Cardiac Imaging    (from care everywhere, [facility])  10/2018 SPECT: Mild inferolateral ischemia on exercise SPECT myocardial perfusion imaging. The patient had no complaints of chest  pain at a moderate functional capacity of 8.5 METs and 90% maximal predicted heart rate for age.  Overall left ventricular systolic function was normal with no regional wall motion abnormalities. The left ventricular ejection fraction was calculated to be 57%.  Previous study: 2015 and 2017 SPECT studies also demonstrated inferolateral ischemia. Today's study suggests no significant progression of the patient's known coronary artery disease. Mild inferolateral ischemia on exercise SPECT myocardial perfusion imaging. The patient had no complaints of chest pain at a moderate functional capacity of 8.5 METs and 90% maximal predicted heart rate for age.  Overall left ventricular systolic function was normal with no regional wall motion abnormalities. The left ventricular ejection fraction was calculated to be 57%.  Previous study: 2015 and 2017 SPECT studies also demonstrated inferolateral ischemia. Today's study suggests no significant progression of the patient's known coronary artery disease.  TTE 07/22/18 Conclusion: 1. Left ventricular ejection fraction by visual estimation is normal at 55 to 60%. 2. Mild mitral valve regurgitation. 3. Moderate mitral regurgitation and left atrial enlargement noted on previous study.   LHC ~ 2008, transcribed as follows: H/o angiogram at [facility] with 80%  stenosis of the mid RCA in addition to disease of his posterior descending artery. His left anterior descending artery a 50-60% narrowing after the origin of the first septal branch, 50-70% stenosis of a diagonal branch. His left circumflex was completely occluded, thrombus was present. His left ventricular systolic function was normal at that time. He subsequently was brought back for stenting to the right coronary artery and PDA with 3 stents and stenting to be totally occluded left circumflex with 2 stents. 2015 Lexiscan SPECT demonstrates very mild vasodilator-induced ischemia involving the mid and distal  inferolateral segments.   Assessment & Plan   64 year old male with history of HTN, diabetes not on insulin, obesity, persistent AF per chart review on apixaban, CAD s/p PCI DES RCA, LCx remotely with non-obstructive LAD disease presenting with acute heart failure syndrome and AF w/ RVR now s/p DCCV.  It's unclear at this time whether AF w/ RVR or worsening heart failure syndrome provoked his AF with subsequent decompensation.  EKG shows non-specific ST changes inferiorly after conversion to sinus rhythm that improved with rate control.  Will require admission for IV diuresis, echocardiogram to exclude systolic heart failure, and exclusion of acute coronary syndrome.   Problem list Atrial fibrillation with rapid ventricular response Acute heart failure syndrome Acute hypoxemic respiratory failure History of CAD s/p PCI to RCA, LCx 2008 Hypertension Diabetes Obesity  Plan Atrial fibrillation with rapid ventricular response, CV4, favor rhythm control strategy. - Continue home apixaban 5 mg BID - S/p 5 mg IV metoprolol in ED.  Increase from home metoprolol succinate 50 mg to 100 mg daily - Consider AAD if recurrent AF here despite volume optimization. - TSH, TTE as below.  Acute heart failure syndrome - Lasix 80 mg IV in ED and reevaluate response. - Complete TTE in AM - Titrate metoprolol as above - Continue home lisinopril 40 mg daily - Continue home dapagloflozin  Acute hypoxemic respiratory failure - Management of heart failure as above - Exclude COVID19 - Bronchodilators as tolerated; no clear evidence pneumonia and stably anticoagulated so low suspicion for pulmonary embolism.  History of CAD s/p PCI to RCA, LCx 2008 - Continue home apixban, aspirin per outside cardiologist - Continue atorvastatin 80 mg daily  Hypertension - Continue metoprolol, lisinopril as above. - Hold home amlodipine while uptitrating metoprolol.  Diabetes - Canagloflozin 100 mg daily to substitute  empagloflozin - SSI  Obesity - Lifestyle modification  Nutrition: Regular diet DVT ppx: Therapeutically anticoagulated Advanced Care Planning: Full code.   Signed, Delight Hoh, MD 10/05/2019, 2:23 AM  Addended for cosign functionality.  Following initial evaluation patient had recurrent respiratory decompensation despite maintenance of sinus rhythm.  Placed on continuous BiPAP and upgraded to inpatient admission on stepdown.  Nitroglycerin paste applied and adjunctive diuretic administered.  Continue metoprolol.  Home antihypertensives (amlodipine, lisinopril) held pending clinical stability.  Good urine output with improving respiratory status on reevaluation.  Mild troponin elevation noted, but suspect demand ischemia in setting of tachyarrhythmia, subendocardial ischemia; repeat pending.

## 2019-10-05 NOTE — Progress Notes (Signed)
ANTICOAGULATION CONSULT NOTE - Initial Consult  Pharmacy Consult for heparin Indication: chest pain/ACS and atrial fibrillation  No Known Allergies  Patient Measurements: Height: 5' 7.5" (171.5 cm) Weight: 119.7 kg (263 lb 14.3 oz) IBW/kg (Calculated) : 67.25 Heparin Dosing Weight: 94.8kg  Vital Signs: Temp: 98.3 F (36.8 C) (05/13 0154) Temp Source: Oral (05/13 0154) BP: 117/92 (05/13 0832) Pulse Rate: 95 (05/13 0745)  Labs: Recent Labs    10/05/19 0108 10/05/19 0108 10/05/19 0136 10/05/19 0143 10/05/19 0244 10/05/19 0254 10/05/19 0541  HGB 14.9   < >  --  16.0  --  17.3*  --   HCT 45.9  --   --  47.0  --  51.0  --   PLT 226  --   --   --   --   --   --   LABPROT  --   --  14.7  --   --   --   --   INR  --   --  1.2  --   --   --   --   CREATININE 1.36*  --   --  1.10 1.49*  --   --   TROPONINIHS 100*  --   --   --  803*  --  3,888*   < > = values in this interval not displayed.    Estimated Creatinine Clearance: 62.6 mL/min (A) (by C-G formula based on SCr of 1.49 mg/dL (H)).   Medical History: Past Medical History:  Diagnosis Date  . CAD (coronary artery disease)   . Diabetes mellitus without complication (HCC)     Medications:  Infusions:  . sodium chloride    . heparin      Assessment: 52 yom presented to the ED with CP. He is on chronic apixaban for history of afib. Troponin is elevated. To transition to IV heparin for possible cardiac cath. CBC is WNL. No bleeding noted. Last dose of apixaban was yesterday 5/12.   Goal of Therapy:  Heparin level 0.3-0.7 units/ml aPTT 66-103 seconds Monitor platelets by anticoagulation protocol: Yes   Plan:  Heparin gtt 1350 units/hr Check an 8 hr heparin level and aPTT Daily heparin level, aPTT and CBC  Carl Little, Drake Leach 10/05/2019,9:06 AM

## 2019-10-05 NOTE — Progress Notes (Signed)
Inpatient Diabetes Program Recommendations  AACE/ADA: New Consensus Statement on Inpatient Glycemic Control (2015)  Target Ranges:  Prepandial:   less than 140 mg/dL      Peak postprandial:   less than 180 mg/dL (1-2 hours)      Critically ill patients:  140 - 180 mg/dL   Lab Results  Component Value Date   GLUCAP 193 (H) 10/05/2019   HGBA1C 7.6 (H) 10/05/2019    Review of Glycemic Control Results for Carl Little, Carl Little (MRN [id number]) as of 10/05/2019 10:58  Ref. Range 10/05/2019 01:38  Glucose-Capillary Latest Ref Range: 70 - 99 mg/dL 311 (H)   Diabetes history: Type 2 DM Outpatient Diabetes medications: Trulicity 0.75 mg Qwk Current orders for Inpatient glycemic control: Invokana 100 mg QAM  Inpatient Diabetes Program Recommendations:    Noted CBGs yesterday while ED and addition of Invokana.   Also consider: -Adding Novolog 0-9 units TID & HS and associated CBGs.   Thanks, Lujean Rave, MSN, RNC-OB Diabetes Coordinator [phone] (8a-5p)

## 2019-10-06 ENCOUNTER — Encounter (HOSPITAL_COMMUNITY)
Admission: EM | Disposition: A | Discharge status: Home / Self Care | Payer: Self-pay | Source: Home / Self Care | Attending: Cardiology

## 2019-10-06 DIAGNOSIS — J810 Acute pulmonary edema: Secondary | ICD-10-CM | POA: Insufficient documentation

## 2019-10-06 DIAGNOSIS — I2510 Atherosclerotic heart disease of native coronary artery without angina pectoris: Secondary | ICD-10-CM

## 2019-10-06 DIAGNOSIS — I5043 Acute on chronic combined systolic (congestive) and diastolic (congestive) heart failure: Secondary | ICD-10-CM

## 2019-10-06 HISTORY — PX: RIGHT/LEFT HEART CATH AND CORONARY ANGIOGRAPHY: CATH118266

## 2019-10-06 LAB — POCT I-STAT EG7
Acid-Base Excess: 6 mmol/L — ABNORMAL HIGH (ref 0.0–2.0)
Bicarbonate: 33.7 mmol/L — ABNORMAL HIGH (ref 20.0–28.0)
Calcium, Ion: 1.22 mmol/L (ref 1.15–1.40)
HCT: 45 % (ref 39.0–52.0)
Hemoglobin: 15.3 g/dL (ref 13.0–17.0)
O2 Saturation: 62 %
Potassium: 4.2 mmol/L (ref 3.5–5.1)
Sodium: 139 mmol/L (ref 135–145)
TCO2: 35 mmol/L — ABNORMAL HIGH (ref 22–32)
pCO2, Ven: 57.9 mmHg (ref 44.0–60.0)
pH, Ven: 7.374 (ref 7.250–7.430)
pO2, Ven: 34 mmHg (ref 32.0–45.0)

## 2019-10-06 LAB — CBC
HCT: 45.9 % (ref 39.0–52.0)
Hemoglobin: 14.9 g/dL (ref 13.0–17.0)
MCH: 30.4 pg (ref 26.0–34.0)
MCHC: 32.5 g/dL (ref 30.0–36.0)
MCV: 93.7 fL (ref 80.0–100.0)
Platelets: 159 10*3/uL (ref 150–400)
RBC: 4.9 MIL/uL (ref 4.22–5.81)
RDW: 14.2 % (ref 11.5–15.5)
WBC: 13.6 10*3/uL — ABNORMAL HIGH (ref 4.0–10.5)
nRBC: 0 % (ref 0.0–0.2)

## 2019-10-06 LAB — APTT: aPTT: 77 seconds — ABNORMAL HIGH (ref 24–36)

## 2019-10-06 LAB — BASIC METABOLIC PANEL
Anion gap: 11 (ref 5–15)
BUN: 22 mg/dL (ref 8–23)
CO2: 27 mmol/L (ref 22–32)
Calcium: 8.6 mg/dL — ABNORMAL LOW (ref 8.9–10.3)
Chloride: 99 mmol/L (ref 98–111)
Creatinine, Ser: 1.08 mg/dL (ref 0.61–1.24)
GFR calc Af Amer: >60 mL/min (ref >60)
GFR calc non Af Amer: >60 mL/min (ref >60)
Glucose, Bld: 155 mg/dL — ABNORMAL HIGH (ref 70–99)
Potassium: 4.3 mmol/L (ref 3.5–5.1)
Sodium: 137 mmol/L (ref 135–145)

## 2019-10-06 LAB — POCT I-STAT 7, (LYTES, BLD GAS, ICA,H+H)
Acid-Base Excess: 6 mmol/L — ABNORMAL HIGH (ref 0.0–2.0)
Bicarbonate: 32.2 mmol/L — ABNORMAL HIGH (ref 20.0–28.0)
Calcium, Ion: 1.21 mmol/L (ref 1.15–1.40)
HCT: 45 % (ref 39.0–52.0)
Hemoglobin: 15.3 g/dL (ref 13.0–17.0)
O2 Saturation: 97 %
Potassium: 4.2 mmol/L (ref 3.5–5.1)
Sodium: 139 mmol/L (ref 135–145)
TCO2: 34 mmol/L — ABNORMAL HIGH (ref 22–32)
pCO2 arterial: 49.8 mmHg — ABNORMAL HIGH (ref 32.0–48.0)
pH, Arterial: 7.418 (ref 7.350–7.450)
pO2, Arterial: 86 mmHg (ref 83.0–108.0)

## 2019-10-06 LAB — GLUCOSE, CAPILLARY
Glucose-Capillary: 136 mg/dL — ABNORMAL HIGH (ref 70–99)
Glucose-Capillary: 131 mg/dL — ABNORMAL HIGH (ref 70–99)
Glucose-Capillary: 141 mg/dL — ABNORMAL HIGH (ref 70–99)
Glucose-Capillary: 94 mg/dL (ref 70–99)

## 2019-10-06 LAB — HEPARIN LEVEL (UNFRACTIONATED): Heparin Unfractionated: 0.74 IU/mL — ABNORMAL HIGH (ref 0.30–0.70)

## 2019-10-06 SURGERY — RIGHT/LEFT HEART CATH AND CORONARY ANGIOGRAPHY
Anesthesia: LOCAL

## 2019-10-06 MED ORDER — HEPARIN SODIUM (PORCINE) 1000 UNIT/ML IJ SOLN
INTRAMUSCULAR | Status: AC
  Filled 2019-10-06: qty 1

## 2019-10-06 MED ORDER — METOPROLOL TARTRATE 50 MG PO TABS
50.0000 mg | ORAL_TABLET | Freq: Two times a day (BID) | ORAL | Status: DC
  Administered 2019-10-06 – 2019-10-07 (×2): 50 mg via ORAL
  Filled 2019-10-06 (×2): qty 1

## 2019-10-06 MED ORDER — SODIUM CHLORIDE 0.9% FLUSH: 3.0000 mL | INTRAVENOUS | Status: DC | PRN

## 2019-10-06 MED ORDER — FUROSEMIDE 10 MG/ML IJ SOLN: 40.0000 mg | Freq: Once | INTRAMUSCULAR | Status: AC

## 2019-10-06 MED ORDER — IOHEXOL 350 MG/ML SOLN
INTRAVENOUS | Status: DC | PRN
  Administered 2019-10-06: 125 mL

## 2019-10-06 MED ORDER — ONDANSETRON HCL 4 MG/2ML IJ SOLN: 4.0000 mg | Freq: Four times a day (QID) | INTRAMUSCULAR | Status: DC | PRN

## 2019-10-06 MED ORDER — FUROSEMIDE 10 MG/ML IJ SOLN
INTRAMUSCULAR | Status: DC | PRN
  Administered 2019-10-06: 40 mg via INTRAVENOUS

## 2019-10-06 MED ORDER — CHLORHEXIDINE GLUCONATE 0.12 % MT SOLN
15.0000 mL | Freq: Two times a day (BID) | OROMUCOSAL | Status: DC
  Administered 2019-10-06 – 2019-10-11 (×7): 15 mL via OROMUCOSAL
  Filled 2019-10-06 (×7): qty 15

## 2019-10-06 MED ORDER — HYDRALAZINE HCL 20 MG/ML IJ SOLN: 10.0000 mg | INTRAMUSCULAR | Status: AC | PRN

## 2019-10-06 MED ORDER — VERAPAMIL HCL 2.5 MG/ML IV SOLN
INTRAVENOUS | Status: AC
  Filled 2019-10-06: qty 2

## 2019-10-06 MED ORDER — LIDOCAINE HCL (PF) 1 % IJ SOLN
INTRAMUSCULAR | Status: DC | PRN
  Administered 2019-10-06 (×2): 3 mL

## 2019-10-06 MED ORDER — HEPARIN SODIUM (PORCINE) 1000 UNIT/ML IJ SOLN
INTRAMUSCULAR | Status: DC | PRN
  Administered 2019-10-06: 6000 [IU] via INTRAVENOUS

## 2019-10-06 MED ORDER — SODIUM CHLORIDE 0.9% FLUSH
3.0000 mL | Freq: Two times a day (BID) | INTRAVENOUS | Status: DC
  Administered 2019-10-06 – 2019-10-07 (×2): 3 mL via INTRAVENOUS

## 2019-10-06 MED ORDER — VERAPAMIL HCL 2.5 MG/ML IV SOLN
INTRAVENOUS | Status: DC | PRN
  Administered 2019-10-06: 10 mL via INTRA_ARTERIAL

## 2019-10-06 MED ORDER — HEPARIN (PORCINE) IN NACL 1000-0.9 UT/500ML-% IV SOLN
INTRAVENOUS | Status: DC | PRN
  Administered 2019-10-06 (×2): 500 mL

## 2019-10-06 MED ORDER — ORAL CARE MOUTH RINSE
15.0000 mL | Freq: Two times a day (BID) | OROMUCOSAL | Status: DC
  Administered 2019-10-06 – 2019-10-09 (×4): 15 mL via OROMUCOSAL

## 2019-10-06 MED ORDER — LABETALOL HCL 5 MG/ML IV SOLN: 10.0000 mg | INTRAVENOUS | Status: AC | PRN

## 2019-10-06 MED ORDER — FUROSEMIDE 10 MG/ML IJ SOLN
INTRAMUSCULAR | Status: AC
  Filled 2019-10-06: qty 4

## 2019-10-06 MED ORDER — LIDOCAINE HCL (PF) 1 % IJ SOLN
INTRAMUSCULAR | Status: AC
  Filled 2019-10-06: qty 30

## 2019-10-06 MED ORDER — HEPARIN (PORCINE) 25000 UT/250ML-% IV SOLN
1350.0000 [IU]/h | INTRAVENOUS | Status: DC
  Administered 2019-10-06: 1650 [IU]/h via INTRAVENOUS
  Administered 2019-10-07: 1550 [IU]/h via INTRAVENOUS
  Filled 2019-10-06 (×4): qty 250

## 2019-10-06 MED ORDER — ACETAMINOPHEN 325 MG PO TABS: 650.0000 mg | ORAL_TABLET | ORAL | Status: DC | PRN

## 2019-10-06 MED ORDER — HEPARIN (PORCINE) IN NACL 1000-0.9 UT/500ML-% IV SOLN
INTRAVENOUS | Status: AC
  Filled 2019-10-06: qty 1000

## 2019-10-06 MED ORDER — SODIUM CHLORIDE 0.9 % IV SOLN: 250.0000 mL | INTRAVENOUS | Status: DC | PRN

## 2019-10-06 SURGICAL SUPPLY — 13 items
CATH 5FR JL3.5 JR4 ANG PIG MP (CATHETERS) ×2 IMPLANT
CATH BALLN WEDGE 5F 110CM (CATHETERS) ×2 IMPLANT
CATH INFINITI 5 FR 3DRC (CATHETERS) ×2 IMPLANT
DEVICE RAD COMP TR BAND LRG (VASCULAR PRODUCTS) ×2 IMPLANT
GLIDESHEATH SLEND A-KIT 6F 22G (SHEATH) ×2 IMPLANT
GUIDEWIRE INQWIRE 1.5J.035X260 (WIRE) ×1 IMPLANT
INQWIRE 1.5J .035X260CM (WIRE) ×2
KIT HEART LEFT (KITS) ×2 IMPLANT
PACK CARDIAC CATHETERIZATION (CUSTOM PROCEDURE TRAY) ×2 IMPLANT
SHEATH GLIDE SLENDER 4/5FR (SHEATH) ×2 IMPLANT
SHEATH PROBE COVER 6X72 (BAG) ×2 IMPLANT
TRANSDUCER W/STOPCOCK (MISCELLANEOUS) ×2 IMPLANT
TUBING CIL FLEX 10 FLL-RA (TUBING) ×2 IMPLANT

## 2019-10-06 NOTE — Progress Notes (Signed)
PA- Judy Pimple  came to see pt. & assessed & ordered to give Lasix & to put him back to Bipap .

## 2019-10-06 NOTE — Care Management (Signed)
1315 10-06-19 Case Manager received consult for Entresto Cost. Case Manager submitted benefits check and will follow for cost. Case Manager will provide patient with 30 day free and co pay card. Gala Lewandowsky, RN,BSN Case Manager

## 2019-10-06 NOTE — Progress Notes (Signed)
 Progress Note  Patient Name: Carl Little Date of Encounter: 10/06/2019  Primary Cardiologist: Florentina Marquart, MD   Subjective   Dyspneic last PM but improved this AM; no CP  Inpatient Medications    Scheduled Meds: . aspirin EC  81 mg Oral Daily  . atorvastatin  80 mg Oral Daily  . canagliflozin  100 mg Oral QAC breakfast  . insulin aspart  0-15 Units Subcutaneous TID WC  . insulin aspart  0-5 Units Subcutaneous QHS  . metoprolol tartrate  25 mg Oral Once  . metoprolol tartrate  25 mg Oral Q6H  . sodium chloride flush  3 mL Intravenous Q12H  . sodium chloride flush  3 mL Intravenous Q12H  . tamsulosin  0.4 mg Oral Daily   Continuous Infusions: . sodium chloride    . sodium chloride    . sodium chloride 30 mL/hr at 10/06/19 0615  . heparin 1,650 Units/hr (10/06/19 0312)   PRN Meds: sodium chloride, sodium chloride, acetaminophen, ondansetron (ZOFRAN) IV, sodium chloride flush, sodium chloride flush, zolpidem   Vital Signs    Vitals:   10/06/19 0018 10/06/19 0405 10/06/19 0752 10/06/19 0812  BP: 110/89 118/79 112/80   Pulse: 89  82 (P) 91  Resp: 17 16 17 (P) 16  Temp: 98.5 F (36.9 C) 97.6 F (36.4 C) 98.2 F (36.8 C)   TempSrc: Axillary Axillary Axillary   SpO2: 94%     Weight:      Height:        Intake/Output Summary (Last 24 hours) at 10/06/2019 0812 Last data filed at 10/06/2019 0110 Gross per 24 hour  Intake 506.18 ml  Output 1400 ml  Net -893.82 ml   Last 3 Weights 10/05/2019 10/05/2019  Weight (lbs) 263 lb 14.3 oz 264 lb  Weight (kg) 119.7 kg 119.75 kg      Telemetry    Sinus with 3 beats NSVT and brief PAT- Personally Reviewed  Physical Exam   GEN: No acute distress.   Neck: No JVD Cardiac: RRR Respiratory: Diminished BS bases GI: Soft, nontender, non-distended  MS: trace edema Neuro:  Nonfocal  Psych: Normal affect   Labs    High Sensitivity Troponin:   Recent Labs  Lab 10/05/19 0108 10/05/19 0244 10/05/19 0541  10/05/19 0729  TROPONINIHS 100* 803* 3,888* 24,409*      Chemistry Recent Labs  Lab 10/05/19 0108 10/05/19 0108 10/05/19 0143 10/05/19 0143 10/05/19 0244 10/05/19 0254 10/06/19 0450  NA 139   < > 138   < > 137 135 137  K 5.6*   < > 4.4   < > 4.6 4.5 4.3  CL 101   < > 99  --  101  --  99  CO2 27  --   --   --  20*  --  27  GLUCOSE 203*   < > 206*  --  227*  --  155*  BUN 23   < > 26*  --  24*  --  22  CREATININE 1.36*   < > 1.10  --  1.49*  --  1.08  CALCIUM 8.9  --   --   --  8.6*  --  8.6*  PROT 6.2*  --   --   --  6.6  --   --   ALBUMIN 3.5  --   --   --  3.9  --   --   AST 71*  --   --   --  66*  --   --     ALT 99*  --   --   --  99*  --   --   ALKPHOS 73  --   --   --  81  --   --   BILITOT 2.0*  --   --   --  1.6*  --   --   GFRNONAA 55*  --   --   --  49*  --  >60  GFRAA >60  --   --   --  57*  --  >60  ANIONGAP 11  --   --   --  16*  --  11   < > = values in this interval not displayed.     Hematology Recent Labs  Lab 10/05/19 0108 10/05/19 0108 10/05/19 0143 10/05/19 0254 10/06/19 0450  WBC 14.8*  --   --   --  13.6*  RBC 4.93  --   --   --  4.90  HGB 14.9   < > 16.0 17.3* 14.9  HCT 45.9   < > 47.0 51.0 45.9  MCV 93.1  --   --   --  93.7  MCH 30.2  --   --   --  30.4  MCHC 32.5  --   --   --  32.5  RDW 14.0  --   --   --  14.2  PLT 226  --   --   --  159   < > = values in this interval not displayed.    BNP Recent Labs  Lab 10/05/19 0244  BNP 428.3*     Radiology    DG Chest Portable 1 View  Result Date: 10/05/2019 CLINICAL DATA:  Shortness of breath EXAM: PORTABLE CHEST 1 VIEW COMPARISON:  Oct 05, 2019, 1:32 a.m. FINDINGS: Again noted is cardiomegaly. There is prominence of the central pulmonary vasculature. There is new mildly increased interstitial opacity seen within both lower lungs. No large airspace consolidation or pleural effusion. No acute osseous abnormality. IMPRESSION: Cardiomegaly and slight interval worsening in interstitial  edema Electronically Signed   By: Bindu  Avutu M.D.   On: 10/05/2019 03:01   DG Chest Portable 1 View  Result Date: 10/05/2019 CLINICAL DATA:  Chest pain EXAM: PORTABLE CHEST 1 VIEW COMPARISON:  None. FINDINGS: Moderate cardiomegaly. Pulmonary vascular congestion without overt edema. No pleural effusion or pneumothorax. No focal airspace consolidation. IMPRESSION: Cardiomegaly and pulmonary vascular congestion without overt edema. Electronically Signed   By: Kevin  Herman M.D.   On: 10/05/2019 01:46   ECHOCARDIOGRAM COMPLETE  Result Date: 10/05/2019    ECHOCARDIOGRAM REPORT   Patient Name:   Kerri Spieler Date of Exam: 10/05/2019 Medical Rec #:  [id number]     Height:       68.0 in Accession #:    [id number]    Weight:       264.0 lb Date of Birth:  06/28/1955      BSA:          2.300 m Patient Age:    63 years      BP:           119/87 mmHg Patient Gender: M             HR:           94 bpm. Exam Location:  Inpatient Procedure: 2D Echo, Color Doppler, Cardiac Doppler and Intracardiac            Opacification Agent Indications:    I48.91* Unspecified   atrial fibrillation  History:        Patient has prior history of Echocardiogram examinations, most                 recent 07/22/2018. CAD, Arrythmias:Atrial Fibrillation; Risk                 Factors:Hypertension, Diabetes and Dyslipidemia. Prior performed                 in [place].  Sonographer:    Emily Senior RDCS Referring Phys: [id number] CARRIEL T NIPP  Sonographer Comments: Technically difficult study due to poor echo windows. Performed supine on BiPap. IMPRESSIONS  1. Left ventricular ejection fraction, by estimation, is 30 to 35%. The left ventricle has moderately decreased function. The left ventricle demonstrates global hypokinesis. The left ventricular internal cavity size was mildly dilated. Left ventricular diastolic parameters are indeterminate.  2. Right ventricular systolic function is normal. The right ventricular size is mildly enlarged. There is  normal pulmonary artery systolic pressure.  3. Left atrial size was severely dilated.  4. Right atrial size was mildly dilated.  5. The mitral valve is grossly normal. Trivial mitral valve regurgitation. No evidence of mitral stenosis.  6. The aortic valve is grossly normal. Aortic valve regurgitation is not visualized. No aortic stenosis is present.  7. The inferior vena cava is dilated in size with >50% respiratory variability, suggesting right atrial pressure of 8 mmHg. Comparison(s): No access to prior study for comparison. FINDINGS  Left Ventricle: Left ventricular ejection fraction, by estimation, is 30 to 35%. The left ventricle has moderately decreased function. The left ventricle demonstrates global hypokinesis. Definity contrast agent was given IV to delineate the left ventricular endocardial borders. The left ventricular internal cavity size was mildly dilated. There is no left ventricular hypertrophy. Left ventricular diastolic parameters are indeterminate. Right Ventricle: The right ventricular size is mildly enlarged. No increase in right ventricular wall thickness. Right ventricular systolic function is normal. There is normal pulmonary artery systolic pressure. The tricuspid regurgitant velocity is 2.30  m/s, and with an assumed right atrial pressure of 8 mmHg, the estimated right ventricular systolic pressure is 29.2 mmHg. Left Atrium: Left atrial size was severely dilated. Right Atrium: Right atrial size was mildly dilated. Pericardium: There is no evidence of pericardial effusion. Mitral Valve: The mitral valve is grossly normal. Trivial mitral valve regurgitation. No evidence of mitral valve stenosis. Tricuspid Valve: The tricuspid valve is normal in structure. Tricuspid valve regurgitation is trivial. No evidence of tricuspid stenosis. Aortic Valve: The aortic valve is grossly normal.. There is mild thickening and mild calcification of the aortic valve. Aortic valve regurgitation is not  visualized. No aortic stenosis is present. There is mild thickening of the aortic valve. There is mild calcification of the aortic valve. Pulmonic Valve: The pulmonic valve was not well visualized. Pulmonic valve regurgitation is not visualized. No evidence of pulmonic stenosis. Aorta: The aortic root and ascending aorta are structurally normal, with no evidence of dilitation. Venous: The inferior vena cava is dilated in size with greater than 50% respiratory variability, suggesting right atrial pressure of 8 mmHg. IAS/Shunts: No atrial level shunt detected by color flow Doppler.  LEFT VENTRICLE PLAX 2D LVIDd:         6.00 cm LVIDs:         5.10 cm LV PW:         0.80 cm LV IVS:        0.70 cm LVOT diam:       2.10 cm LV SV:         47 LV SV Index:   20 LVOT Area:     3.46 cm  RIGHT VENTRICLE RV S prime:     10.10 cm/s TAPSE (M-mode): 1.8 cm LEFT ATRIUM              Index       RIGHT ATRIUM           Index LA diam:        5.00 cm  2.17 cm/m  RA Area:     21.40 cm LA Vol (A2C):   97.1 ml  42.22 ml/m RA Volume:   63.10 ml  27.44 ml/m LA Vol (A4C):   131.5 ml 57.18 ml/m LA Biplane Vol: 125.0 ml 54.35 ml/m  AORTIC VALVE LVOT Vmax:   76.70 cm/s LVOT Vmean:  51.600 cm/s LVOT VTI:    0.136 m  AORTA Ao Root diam: 3.20 cm Ao Asc diam:  3.60 cm TRICUSPID VALVE TR Peak grad:   21.2 mmHg TR Vmax:        230.00 cm/s  SHUNTS Systemic VTI:  0.14 m Systemic Diam: 2.10 cm Bridgette Christopher MD Electronically signed by Bridgette Christopher MD Signature Date/Time: 10/05/2019/11:32:01 AM    Final     Patient Profile     63 y.o. male with past medical history of coronary artery disease, diabetes mellitus, atrial fibrillation admitted with atrial fibrillation with rapid ventricular response, acute pulmonary edema and chest pain.  Patient cardioverted emergently in the emergency room.  He has ruled in for myocardial infarction.  Echocardiogram shows ejection fraction 30 to 35%, mild right ventricular enlargement, biatrial  enlargement.  Assessment & Plan    1 paroxysmal atrial fibrillation-patient underwent emergent cardioversion in the emergency room with improvement in his symptoms.  He remains in sinus rhythm this morning.  Continue metoprolol.  He is on IV heparin in anticipation of cardiac catheterization.  This will need to be resumed following procedure given recent cardioversion.  Will need to transition to apixaban once all procedures complete.  2 non-ST elevation myocardial infarction-patient is pain-free today.  Plan to proceed with cardiac catheterization.  The risks and benefits including myocardial infarction, CVA and death discussed and he agrees to proceed.  Plan to continue aspirin, beta-blocker and statin.  Continue heparin.  3 acute combined systolic/diastolic congestive heart failure-patient mildly dyspneic this morning.  We will give 1 dose of Lasix 40 mg IV x1.  Follow renal function closely.  We will not hydrate prior to catheterization.  4 cardiomyopathy-likely ischemic in etiology.  Will change Lopressor to 50 mg twice daily and transition to Toprol at discharge.  Will add ARB following catheterization once renal function stable and if blood pressure allows.  5 diabetes mellitus-follow CBGs.  For questions or updates, please contact [facility] Please consult [email or web address] for contact info under        Signed, Tavionna Grout, MD  10/06/2019, 8:12 AM    

## 2019-10-06 NOTE — Progress Notes (Signed)
Heart Failure Stewardship Pharmacist Progress Note   PCP: System, Pcp Not In PCP-Cardiologist: Olga Millers, MD    HPI:  64 yo M with PMH significant for CAD (s/p PCI of RCA and LCx), diabetes mellitus, and atrial fibrillation admitted on 5/13 for afib with RVR, acute pulmonary edema, and chest pain.   He was then cardioverted x 2 in the ED to sinus rhythm with improved ventricular rate.  ECHO on 10/05/19 with newly reduced LV dysfunction - LVEF 30-35%.  He has been receiving IV diuretics for volume overload during admission.  Right and left heart catheterization completed 10/06/19. Found to have severe native vessel disease with moderate distal in-stent restenosis in RCA. LCx occluded with poor collaterals. No interventions/PCI completed pending further discussion with interventional +/- surgical team. Moderate pulmonary hypertension: RA 15, PA 44, PCWP 34, LVEDP 27, CO/CI 4.32/1.88.   Current HF Medications: Furosemide 40 mg IV x 1 Metoprolol 50 mg BID Invokana 100 mg (substitiution for [place])  Prior to admission HF Medications: Metoprolol succinate 50 mg daily Lisinopril 40 mg daily Farxiga 5 mg daily  Pertinent Lab Values: . Serum creatinine 1.08, BUN 22, Potassium 4.3, Sodium 137, BNP 428.3   Vital Signs: . Weight: 263 lbs (estimated dry weight: unkown) . Blood pressure: 110/80s  . Heart rate: 80-90s   Medication Assistance / Insurance Benefits Check: Does the patient have prescription insurance?   yes Type of insurance plan: commercial - [employer]  Does the patient qualify for medication assistance through manufacturers or grants?   yes  Eligible grants and/or patient assistance programs: pending  Medication assistance applications in progress: None  Medication assistance applications approved: None  Approved medication assistance renewals will be completed by: Dr. Ludwig Clarks office  Outpatient Pharmacy:  [facility]  [facility]armacy Is the patient willing to use [facility] TOC pharmacy at discharge?   TBD Is the patient willing to transition their outpatient pharmacy to utilize a [facility] outpatient pharmacy?   TBD    Assessment: 1. Acute systolic CHF (EF 43-15%), possible ischemic in etiology. L/RHC completed today with severe native vessel disease, in-stent restenosis, and moderate pulmonary hypertension. NYHA class II symptoms. Volume overloaded on MD exam. - Furosemide 40 mg IV x 1 - follow up with MD for additional orders given cath findings today - Continue metoprolol 50 mg BID - consider transitioning back to metoprolol succinate prior to discharge - Consider starting Entresto 24/26 mg BID - last dose of lisinopril was 10/04/19. - Consider adding spironolactone prior to discharge - Continue Invokana (substitution for [place]).    Plan: 1) Medication changes recommended at this time: - None - will hold off adding Entresto at this time due to recent contrast use today and possible PCI next week.  2) Patient assistance application(s): - None  3)  Education  - To be completed prior to discharge  Danae Orleans, PharmD, BCPS Heart Failure Stewardship Pharmacist Phone [phone] 10/06/2019       10:12 AM

## 2019-10-06 NOTE — Progress Notes (Addendum)
ANTICOAGULATION CONSULT NOTE - Follow Up Consult  Pharmacy Consult for heparin Indication: chest pain/ACS and atrial fibrillation  Assessment: 64 yr old male presented to the ED with CP and elevated troponin (NSTEMI). He is on chronic apixaban for history of afib (last dose was yesterday, 10/04/19).  He is now s/p cardiac cath with in-stent restenosis and plans for staged cath next week. Pharmacy to restart heparin 8 hours post sheath removal  Goal of Therapy:  Heparin level 0.3-0.7 units/ml aPTT 66-103 seconds Monitor platelets by anticoagulation protocol: Yes   Plan:  -resume heparin at 1650 units/hr at 7:30pm -Daily aPTT, HL/CBC  Thanks for allowing pharmacy to be a part of this patient's care.  Harland German, PharmD Clinical Pharmacist **Pharmacist phone directory can now be found on [email or web address] ([email or web address]).  Listed under [facility].

## 2019-10-06 NOTE — Care Management (Signed)
Per  Josephina Shih W/[facility] ph# [phone]  Co-pay amount for Entresto 24/26 mg bid for a 30 day supply $40.00. at a retail pharmacy. Mail order Pharmacy :[facility] for a 90 day supply $100.00.  No PA required Deductible met. Tier 2 [facility]: [facility].

## 2019-10-06 NOTE — Interval H&P Note (Signed)
History and Physical Interval Note:  10/06/2019 10:29 AM  High risk with possibility of AKI. Borderline CHF compensation.Cath Lab Visit (complete for each Cath Lab visit)  Clinical Evaluation Leading to the Procedure:   ACS: Yes.    Non-ACS:    Anginal Classification: CCS IV  Anti-ischemic medical therapy: Maximal Therapy (2 or more classes of medications)  Non-Invasive Test Results: No non-invasive testing performed  Prior CABG: No previous CABG        Carl Little  has presented today for surgery, with the diagnosis of nstemi.  The various methods of treatment have been discussed with the patient and family. After consideration of risks, benefits and other options for treatment, the patient has consented to  Procedure(s): RIGHT/LEFT HEART CATH AND CORONARY ANGIOGRAPHY (N/A) as a surgical intervention.  The patient's history has been reviewed, patient examined, no change in status, stable for surgery.  I have reviewed the patient's chart and labs.  Questions were answered to the patient's satisfaction.     Lyn Records III

## 2019-10-06 NOTE — Progress Notes (Signed)
ANTICOAGULATION CONSULT NOTE - Follow Up Consult  Pharmacy Consult for heparin Indication: chest pain/ACS and atrial fibrillation  Assessment: 64 yr old male presented to the ED with CP and elevated troponin (NSTEMI). He is on chronic apixaban for history of afib (last dose was yesterday, 10/04/19).  Pt was transitioned to IV heparin for possible cardiac cath. H/H 17.3/51.0, platelets 226. Because pt was recently on apixaban, we will monitor anticoagulation using aPTT until aPTT and heparin levels correlate.  APTT 77 sec, in goal range Goal of Therapy:  Heparin level 0.3-0.7 units/ml aPTT 66-103 seconds Monitor platelets by anticoagulation protocol: Yes   Plan:  Continue heparin infusion at 1650 units/hr Monitor daily heparin level, aPTT, CBC Monitor for signs/symptoms of bleeding F/U plans after cardiac cath  Thanks for allowing pharmacy to be a part of this patient's care.  Talbert Cage, PharmD Clinical Pharmacist  10/06/2019,6:12 AM

## 2019-10-06 NOTE — Progress Notes (Signed)
Pt.had >9 secs.SVT as per CCMD .  MD on call Truman Hayward made aware

## 2019-10-06 NOTE — H&P (View-Only) (Signed)
Progress Note  Patient Name: Carl Little Date of Encounter: 10/06/2019  Primary Cardiologist: Kirk Ruths, MD   Subjective   Dyspneic last PM but improved this AM; no CP  Inpatient Medications    Scheduled Meds: . aspirin EC  81 mg Oral Daily  . atorvastatin  80 mg Oral Daily  . canagliflozin  100 mg Oral QAC breakfast  . insulin aspart  0-15 Units Subcutaneous TID WC  . insulin aspart  0-5 Units Subcutaneous QHS  . metoprolol tartrate  25 mg Oral Once  . metoprolol tartrate  25 mg Oral Q6H  . sodium chloride flush  3 mL Intravenous Q12H  . sodium chloride flush  3 mL Intravenous Q12H  . tamsulosin  0.4 mg Oral Daily   Continuous Infusions: . sodium chloride    . sodium chloride    . sodium chloride 30 mL/hr at 10/06/19 0615  . heparin 1,650 Units/hr (10/06/19 0312)   PRN Meds: sodium chloride, sodium chloride, acetaminophen, ondansetron (ZOFRAN) IV, sodium chloride flush, sodium chloride flush, zolpidem   Vital Signs    Vitals:   10/06/19 0018 10/06/19 0405 10/06/19 0752 10/06/19 0812  BP: 110/89 118/79 112/80   Pulse: 89  82 (P) 91  Resp: 17 16 17  (P) 16  Temp: 98.5 F (36.9 C) 97.6 F (36.4 C) 98.2 F (36.8 C)   TempSrc: Axillary Axillary Axillary   SpO2: 94%     Weight:      Height:        Intake/Output Summary (Last 24 hours) at 10/06/2019 0812 Last data filed at 10/06/2019 0110 Gross per 24 hour  Intake 506.18 ml  Output 1400 ml  Net -893.82 ml   Last 3 Weights 10/05/2019 10/05/2019  Weight (lbs) 263 lb 14.3 oz 264 lb  Weight (kg) 119.7 kg 119.75 kg      Telemetry    Sinus with 3 beats NSVT and brief PAT- Personally Reviewed  Physical Exam   GEN: No acute distress.   Neck: No JVD Cardiac: RRR Respiratory: Diminished BS bases GI: Soft, nontender, non-distended  MS: trace edema Neuro:  Nonfocal  Psych: Normal affect   Labs    High Sensitivity Troponin:   Recent Labs  Lab 10/05/19 0108 10/05/19 0244 10/05/19 0541  10/05/19 0729  TROPONINIHS 100* 803* 3,888* 24,409*      Chemistry Recent Labs  Lab 10/05/19 0108 10/05/19 0108 10/05/19 0143 10/05/19 0143 10/05/19 0244 10/05/19 0254 10/06/19 0450  NA 139   < > 138   < > 137 135 137  K 5.6*   < > 4.4   < > 4.6 4.5 4.3  CL 101   < > 99  --  101  --  99  CO2 27  --   --   --  20*  --  27  GLUCOSE 203*   < > 206*  --  227*  --  155*  BUN 23   < > 26*  --  24*  --  22  CREATININE 1.36*   < > 1.10  --  1.49*  --  1.08  CALCIUM 8.9  --   --   --  8.6*  --  8.6*  PROT 6.2*  --   --   --  6.6  --   --   ALBUMIN 3.5  --   --   --  3.9  --   --   AST 71*  --   --   --  66*  --   --  ALT 99*  --   --   --  99*  --   --   ALKPHOS 73  --   --   --  81  --   --   BILITOT 2.0*  --   --   --  1.6*  --   --   GFRNONAA 55*  --   --   --  49*  --  >60  GFRAA >60  --   --   --  57*  --  >60  ANIONGAP 11  --   --   --  16*  --  11   < > = values in this interval not displayed.     Hematology Recent Labs  Lab 10/05/19 0108 10/05/19 0108 10/05/19 0143 10/05/19 0254 10/06/19 0450  WBC 14.8*  --   --   --  13.6*  RBC 4.93  --   --   --  4.90  HGB 14.9   < > 16.0 17.3* 14.9  HCT 45.9   < > 47.0 51.0 45.9  MCV 93.1  --   --   --  93.7  MCH 30.2  --   --   --  30.4  MCHC 32.5  --   --   --  32.5  RDW 14.0  --   --   --  14.2  PLT 226  --   --   --  159   < > = values in this interval not displayed.    BNP Recent Labs  Lab 10/05/19 0244  BNP 428.3*     Radiology    DG Chest Portable 1 View  Result Date: 10/05/2019 CLINICAL DATA:  Shortness of breath EXAM: PORTABLE CHEST 1 VIEW COMPARISON:  Oct 05, 2019, 1:32 a.m. FINDINGS: Again noted is cardiomegaly. There is prominence of the central pulmonary vasculature. There is new mildly increased interstitial opacity seen within both lower lungs. No large airspace consolidation or pleural effusion. No acute osseous abnormality. IMPRESSION: Cardiomegaly and slight interval worsening in interstitial  edema Electronically Signed   By: Jonna Clark M.D.   On: 10/05/2019 03:01   DG Chest Portable 1 View  Result Date: 10/05/2019 CLINICAL DATA:  Chest pain EXAM: PORTABLE CHEST 1 VIEW COMPARISON:  None. FINDINGS: Moderate cardiomegaly. Pulmonary vascular congestion without overt edema. No pleural effusion or pneumothorax. No focal airspace consolidation. IMPRESSION: Cardiomegaly and pulmonary vascular congestion without overt edema. Electronically Signed   By: Deatra Robinson M.D.   On: 10/05/2019 01:46   ECHOCARDIOGRAM COMPLETE  Result Date: 10/05/2019    ECHOCARDIOGRAM REPORT   Patient Name:   Carl Little Date of Exam: 10/05/2019 Medical Rec #:  [id number]     Height:       68.0 in Accession #:    [id number]    Weight:       264.0 lb Date of Birth:  26-May-1955      BSA:          2.300 m Patient Age:    63 years      BP:           119/87 mmHg Patient Gender: M             HR:           94 bpm. Exam Location:  Inpatient Procedure: 2D Echo, Color Doppler, Cardiac Doppler and Intracardiac            Opacification Agent Indications:    I48.91* Unspecified  atrial fibrillation  History:        Patient has prior history of Echocardiogram examinations, most                 recent 07/22/2018. CAD, Arrythmias:Atrial Fibrillation; Risk                 Factors:Hypertension, Diabetes and Dyslipidemia. Prior performed                 in [place].  Sonographer:    Irving Burton Senior RDCS Referring Phys: [id number] CARRIEL T NIPP  Sonographer Comments: Technically difficult study due to poor echo windows. Performed supine on BiPap. IMPRESSIONS  1. Left ventricular ejection fraction, by estimation, is 30 to 35%. The left ventricle has moderately decreased function. The left ventricle demonstrates global hypokinesis. The left ventricular internal cavity size was mildly dilated. Left ventricular diastolic parameters are indeterminate.  2. Right ventricular systolic function is normal. The right ventricular size is mildly enlarged. There is  normal pulmonary artery systolic pressure.  3. Left atrial size was severely dilated.  4. Right atrial size was mildly dilated.  5. The mitral valve is grossly normal. Trivial mitral valve regurgitation. No evidence of mitral stenosis.  6. The aortic valve is grossly normal. Aortic valve regurgitation is not visualized. No aortic stenosis is present.  7. The inferior vena cava is dilated in size with >50% respiratory variability, suggesting right atrial pressure of 8 mmHg. Comparison(s): No access to prior study for comparison. FINDINGS  Left Ventricle: Left ventricular ejection fraction, by estimation, is 30 to 35%. The left ventricle has moderately decreased function. The left ventricle demonstrates global hypokinesis. Definity contrast agent was given IV to delineate the left ventricular endocardial borders. The left ventricular internal cavity size was mildly dilated. There is no left ventricular hypertrophy. Left ventricular diastolic parameters are indeterminate. Right Ventricle: The right ventricular size is mildly enlarged. No increase in right ventricular wall thickness. Right ventricular systolic function is normal. There is normal pulmonary artery systolic pressure. The tricuspid regurgitant velocity is 2.30  m/s, and with an assumed right atrial pressure of 8 mmHg, the estimated right ventricular systolic pressure is 29.2 mmHg. Left Atrium: Left atrial size was severely dilated. Right Atrium: Right atrial size was mildly dilated. Pericardium: There is no evidence of pericardial effusion. Mitral Valve: The mitral valve is grossly normal. Trivial mitral valve regurgitation. No evidence of mitral valve stenosis. Tricuspid Valve: The tricuspid valve is normal in structure. Tricuspid valve regurgitation is trivial. No evidence of tricuspid stenosis. Aortic Valve: The aortic valve is grossly normal.. There is mild thickening and mild calcification of the aortic valve. Aortic valve regurgitation is not  visualized. No aortic stenosis is present. There is mild thickening of the aortic valve. There is mild calcification of the aortic valve. Pulmonic Valve: The pulmonic valve was not well visualized. Pulmonic valve regurgitation is not visualized. No evidence of pulmonic stenosis. Aorta: The aortic root and ascending aorta are structurally normal, with no evidence of dilitation. Venous: The inferior vena cava is dilated in size with greater than 50% respiratory variability, suggesting right atrial pressure of 8 mmHg. IAS/Shunts: No atrial level shunt detected by color flow Doppler.  LEFT VENTRICLE PLAX 2D LVIDd:         6.00 cm LVIDs:         5.10 cm LV PW:         0.80 cm LV IVS:        0.70 cm LVOT diam:  2.10 cm LV SV:         47 LV SV Index:   20 LVOT Area:     3.46 cm  RIGHT VENTRICLE RV S prime:     10.10 cm/s TAPSE (M-mode): 1.8 cm LEFT ATRIUM              Index       RIGHT ATRIUM           Index LA diam:        5.00 cm  2.17 cm/m  RA Area:     21.40 cm LA Vol (A2C):   97.1 ml  42.22 ml/m RA Volume:   63.10 ml  27.44 ml/m LA Vol (A4C):   131.5 ml 57.18 ml/m LA Biplane Vol: 125.0 ml 54.35 ml/m  AORTIC VALVE LVOT Vmax:   76.70 cm/s LVOT Vmean:  51.600 cm/s LVOT VTI:    0.136 m  AORTA Ao Root diam: 3.20 cm Ao Asc diam:  3.60 cm TRICUSPID VALVE TR Peak grad:   21.2 mmHg TR Vmax:        230.00 cm/s  SHUNTS Systemic VTI:  0.14 m Systemic Diam: 2.10 cm Jodelle Red MD Electronically signed by Jodelle Red MD Signature Date/Time: 10/05/2019/11:32:01 AM    Final     Patient Profile     64 y.o. male with past medical history of coronary artery disease, diabetes mellitus, atrial fibrillation admitted with atrial fibrillation with rapid ventricular response, acute pulmonary edema and chest pain.  Patient cardioverted emergently in the emergency room.  He has ruled in for myocardial infarction.  Echocardiogram shows ejection fraction 30 to 35%, mild right ventricular enlargement, biatrial  enlargement.  Assessment & Plan    1 paroxysmal atrial fibrillation-patient underwent emergent cardioversion in the emergency room with improvement in his symptoms.  He remains in sinus rhythm this morning.  Continue metoprolol.  He is on IV heparin in anticipation of cardiac catheterization.  This will need to be resumed following procedure given recent cardioversion.  Will need to transition to apixaban once all procedures complete.  2 non-ST elevation myocardial infarction-patient is pain-free today.  Plan to proceed with cardiac catheterization.  The risks and benefits including myocardial infarction, CVA and death discussed and he agrees to proceed.  Plan to continue aspirin, beta-blocker and statin.  Continue heparin.  3 acute combined systolic/diastolic congestive heart failure-patient mildly dyspneic this morning.  We will give 1 dose of Lasix 40 mg IV x1.  Follow renal function closely.  We will not hydrate prior to catheterization.  4 cardiomyopathy-likely ischemic in etiology.  Will change Lopressor to 50 mg twice daily and transition to Toprol at discharge.  Will add ARB following catheterization once renal function stable and if blood pressure allows.  5 diabetes mellitus-follow CBGs.  For questions or updates, please contact [facility] Please consult [email or web address] for contact info under        Signed, Olga Millers, MD  10/06/2019, 8:12 AM

## 2019-10-06 NOTE — Progress Notes (Signed)
Pt was taken off of BIPAP per Md and placed on 7 L [place]. Pt is not in distress at this time and is tolerating [place].

## 2019-10-07 DIAGNOSIS — I5021 Acute systolic (congestive) heart failure: Secondary | ICD-10-CM

## 2019-10-07 LAB — HEPARIN LEVEL (UNFRACTIONATED)
Heparin Unfractionated: 0.67 IU/mL (ref 0.30–0.70)
Heparin Unfractionated: 0.74 IU/mL — ABNORMAL HIGH (ref 0.30–0.70)
Heparin Unfractionated: 0.69 IU/mL (ref 0.30–0.70)

## 2019-10-07 LAB — BASIC METABOLIC PANEL
Anion gap: 12 (ref 5–15)
Anion gap: 12 (ref 5–15)
BUN: 26 mg/dL — ABNORMAL HIGH (ref 8–23)
BUN: 29 mg/dL — ABNORMAL HIGH (ref 8–23)
CO2: 29 mmol/L (ref 22–32)
CO2: 28 mmol/L (ref 22–32)
Calcium: 8.7 mg/dL — ABNORMAL LOW (ref 8.9–10.3)
Calcium: 8.9 mg/dL (ref 8.9–10.3)
Chloride: 98 mmol/L (ref 98–111)
Chloride: 97 mmol/L — ABNORMAL LOW (ref 98–111)
Creatinine, Ser: 1.27 mg/dL — ABNORMAL HIGH (ref 0.61–1.24)
Creatinine, Ser: 1.27 mg/dL — ABNORMAL HIGH (ref 0.61–1.24)
GFR calc Af Amer: >60 mL/min (ref >60)
GFR calc Af Amer: >60 mL/min (ref >60)
GFR calc non Af Amer: 59 mL/min — ABNORMAL LOW (ref >60)
GFR calc non Af Amer: 59 mL/min — ABNORMAL LOW (ref >60)
Glucose, Bld: 100 mg/dL — ABNORMAL HIGH (ref 70–99)
Glucose, Bld: 121 mg/dL — ABNORMAL HIGH (ref 70–99)
Potassium: 4.2 mmol/L (ref 3.5–5.1)
Potassium: 4.3 mmol/L (ref 3.5–5.1)
Sodium: 139 mmol/L (ref 135–145)
Sodium: 137 mmol/L (ref 135–145)

## 2019-10-07 LAB — CBC
HCT: 44.3 % (ref 39.0–52.0)
Hemoglobin: 14 g/dL (ref 13.0–17.0)
MCH: 29.9 pg (ref 26.0–34.0)
MCHC: 31.6 g/dL (ref 30.0–36.0)
MCV: 94.7 fL (ref 80.0–100.0)
Platelets: 158 10*3/uL (ref 150–400)
RBC: 4.68 MIL/uL (ref 4.22–5.81)
RDW: 13.8 % (ref 11.5–15.5)
WBC: 10.8 10*3/uL — ABNORMAL HIGH (ref 4.0–10.5)
nRBC: 0 % (ref 0.0–0.2)

## 2019-10-07 LAB — GLUCOSE, CAPILLARY
Glucose-Capillary: 92 mg/dL (ref 70–99)
Glucose-Capillary: 110 mg/dL — ABNORMAL HIGH (ref 70–99)
Glucose-Capillary: 165 mg/dL — ABNORMAL HIGH (ref 70–99)
Glucose-Capillary: 168 mg/dL — ABNORMAL HIGH (ref 70–99)

## 2019-10-07 LAB — APTT: aPTT: 107 seconds — ABNORMAL HIGH (ref 24–36)

## 2019-10-07 MED ORDER — HYDROCORTISONE 1 % EX CREA
TOPICAL_CREAM | Freq: Two times a day (BID) | CUTANEOUS | Status: DC
  Filled 2019-10-07: qty 28

## 2019-10-07 MED ORDER — FUROSEMIDE 20 MG PO TABS
20.0000 mg | ORAL_TABLET | Freq: Two times a day (BID) | ORAL | Status: DC
  Administered 2019-10-07: 20 mg via ORAL
  Filled 2019-10-07: qty 1

## 2019-10-07 MED ORDER — AMIODARONE LOAD VIA INFUSION
150.0000 mg | Freq: Once | INTRAVENOUS | Status: AC
  Administered 2019-10-07: 150 mg via INTRAVENOUS
  Filled 2019-10-07: qty 83.34

## 2019-10-07 MED ORDER — AMIODARONE HCL IN DEXTROSE 360-4.14 MG/200ML-% IV SOLN
30.0000 mg/h | INTRAVENOUS | Status: DC
  Administered 2019-10-08 – 2019-10-10 (×6): 30 mg/h via INTRAVENOUS
  Filled 2019-10-07 (×6): qty 200

## 2019-10-07 MED ORDER — AMIODARONE HCL IN DEXTROSE 360-4.14 MG/200ML-% IV SOLN
60.0000 mg/h | INTRAVENOUS | Status: AC
  Administered 2019-10-07: 60 mg/h via INTRAVENOUS
  Filled 2019-10-07: qty 200

## 2019-10-07 MED ORDER — CARVEDILOL 3.125 MG PO TABS
3.1250 mg | ORAL_TABLET | Freq: Two times a day (BID) | ORAL | Status: DC
  Administered 2019-10-07 – 2019-10-10 (×6): 3.125 mg via ORAL
  Filled 2019-10-07 (×6): qty 1

## 2019-10-07 NOTE — Progress Notes (Signed)
Progress Note  Patient Name: Carl Little Date of Encounter: 10/07/2019  Primary Cardiologist: Olga Millers, MD   Subjective   Breathing ok, no chest pain. Complicated situation he seems to have good understanding He is working in [place] for a year and a half but lives in [place] and has a Dr Orlene Och There Sister Bridgette Habermann is POA lives in [place]  Inpatient Medications    Scheduled Meds: . aspirin EC  81 mg Oral Daily  . atorvastatin  80 mg Oral Daily  . canagliflozin  100 mg Oral QAC breakfast  . chlorhexidine  15 mL Mouth Rinse BID  . insulin aspart  0-15 Units Subcutaneous TID WC  . insulin aspart  0-5 Units Subcutaneous QHS  . mouth rinse  15 mL Mouth Rinse q12n4p  . metoprolol tartrate  25 mg Oral Once  . metoprolol tartrate  50 mg Oral BID  . sodium chloride flush  3 mL Intravenous Q12H  . sodium chloride flush  3 mL Intravenous Q12H  . tamsulosin  0.4 mg Oral Daily   Continuous Infusions: . sodium chloride    . sodium chloride    . heparin 1,650 Units/hr (10/06/19 1927)   PRN Meds: sodium chloride, sodium chloride, acetaminophen, ondansetron (ZOFRAN) IV, sodium chloride flush, sodium chloride flush, zolpidem   Vital Signs    Vitals:   10/06/19 2217 10/07/19 0049 10/07/19 0520 10/07/19 0848  BP:  124/87 119/86 98/64  Pulse: 100 92 94 86  Resp: 18 17 17    Temp:  98.6 F (37 C) 98 F (36.7 C) (!) 97.4 F (36.3 C)  TempSrc:  Oral Oral Oral  SpO2: 98% 100% 92% 99%  Weight:   113.4 kg   Height:        Intake/Output Summary (Last 24 hours) at 10/07/2019 1103 Last data filed at 10/07/2019 0500 Gross per 24 hour  Intake 616.46 ml  Output 2925 ml  Net -2308.54 ml   Last 3 Weights 10/07/2019 10/05/2019 10/05/2019  Weight (lbs) 250 lb 263 lb 14.3 oz 264 lb  Weight (kg) 113.4 kg 119.7 kg 119.75 kg      Telemetry    Back in afib   Physical Exam   Affect appropriate Healthy:  appears stated age HEENT: normal Neck supple with no adenopathy JVP  normal no bruits no thyromegaly Lungs clear with no wheezing and good diaphragmatic motion Heart:  S1/S2 no murmur, no rub, gallop or click PMI normal Abdomen: benighn, BS positve, no tenderness, no AAA no bruit.  No HSM or HJR Distal pulses intact with no bruits Plus one bilateral edema Neuro non-focal Skin warm and dry No muscular weakness   Labs    High Sensitivity Troponin:   Recent Labs  Lab 10/05/19 0108 10/05/19 0244 10/05/19 0541 10/05/19 0729  TROPONINIHS 100* 803* 3,888* 24,409*      Chemistry Recent Labs  Lab 10/05/19 0108 10/05/19 0143 10/05/19 0244 10/05/19 0254 10/06/19 0450 10/06/19 0450 10/06/19 1107 10/06/19 1109 10/07/19 0506  NA 139   < > 137   < > 137   < > 139 139 139  K 5.6*   < > 4.6   < > 4.3   < > 4.2 4.2 4.2  CL 101   < > 101  --  99  --   --   --  98  CO2 27   < > 20*  --  27  --   --   --  29  GLUCOSE 203*   < >  227*  --  155*  --   --   --  100*  BUN 23   < > 24*  --  22  --   --   --  26*  CREATININE 1.36*   < > 1.49*  --  1.08  --   --   --  1.27*  CALCIUM 8.9   < > 8.6*  --  8.6*  --   --   --  8.7*  PROT 6.2*  --  6.6  --   --   --   --   --   --   ALBUMIN 3.5  --  3.9  --   --   --   --   --   --   AST 71*  --  66*  --   --   --   --   --   --   ALT 99*  --  99*  --   --   --   --   --   --   ALKPHOS 73  --  81  --   --   --   --   --   --   BILITOT 2.0*  --  1.6*  --   --   --   --   --   --   GFRNONAA 55*   < > 49*  --  >60  --   --   --  59*  GFRAA >60   < > 57*  --  >60  --   --   --  >60  ANIONGAP 11   < > 16*  --  11  --   --   --  12   < > = values in this interval not displayed.     Hematology Recent Labs  Lab 10/05/19 0108 10/05/19 0143 10/06/19 0450 10/06/19 0450 10/06/19 1107 10/06/19 1109 10/07/19 0506  WBC 14.8*  --  13.6*  --   --   --  10.8*  RBC 4.93  --  4.90  --   --   --  4.68  HGB 14.9   < > 14.9   < > 15.3 15.3 14.0  HCT 45.9   < > 45.9   < > 45.0 45.0 44.3  MCV 93.1  --  93.7  --   --   --   94.7  MCH 30.2  --  30.4  --   --   --  29.9  MCHC 32.5  --  32.5  --   --   --  31.6  RDW 14.0  --  14.2  --   --   --  13.8  PLT 226  --  159  --   --   --  158   < > = values in this interval not displayed.    BNP Recent Labs  Lab 10/05/19 0244  BNP 428.3*     Radiology    CARDIAC CATHETERIZATION  Result Date: 10/06/2019  Severe native vessel coronary disease with anatomically small left anterior descending and the anterior interventricular groove with much of the anterior wall territory supplied by a large branching diagonal.  Right dominant anatomy with the right coronary containing a proximal stent, mid to distal stent, and PDA stent.  Moderate distal in-stent restenosis involving the proximal segment, the nonstented mid segment contains diffuse 60% narrowing with 99% proximal margin in-stent restenosis, and 99% in-stent restenosis of the PDA stent.  The PDA does reach the left ventricular apex but was difficult to completely visualize due to respiratory disengagement of the diagnostic catheter.  Multiple catheters were used.  Circumflex contains a long stented segment from the mid circumflex into a large second obtuse marginal that is totally occluded.  A smaller second obtuse marginal is jailed by the stent and contains ostial 99% stenosis with slow flow.  Vessel diameter is small.  The larger stented branch fills faintly by left to left collaterals.  The LAD contains no significant obstruction before the origin of the large diagonal.  The diagonal contains perhaps 30 to 40% ostial narrowing.  The LAD beyond the origin of the diagonal contains 80% stenosis.  The LAD proper is small.  Moderate to severe pulmonary hypertension  Mean pulmonary capillary wedge pressure 34 mmHg  Left ventricular end-diastolic pressure 28 mmHg.  Consistent with acute on chronic combined systolic and diastolic heart failure.  Patient is unable to lie flat today for the procedure but was done with him  sitting at 50 degrees and breathing high flow oxygen to maintain O2 sats greater than 95% RECOMMENDATIONS:  First order of business is management of volume overload with aggressive diuresis and institution of guideline directed therapy for heart failure.  Whether or not revascularization should be attempted should be debated by the interventional team.  The right coronary is a target but each of 3 stents has significant in-stent restenosis, therefore long-term patency would be questionable.  The circumflex is totally occluded with poor collaterals.  The LAD is anatomically small but without high-grade obstruction.  This anatomy does not seem to be very conducive to surgical revascularization.  Concerned about prognosis in this patient given the anatomic substrate, diffuse in-stent restenosis throughout the right coronary and total occlusion and long stents placed in the circumflex.  Resume IV heparin.  Early next week, need to determine if PCI is a reasonable revascularization approach after heart failure is controlled.  If PCI is performed, it should be from the femoral approach due to difficulty with catheter engagement of the right coronary.   Patient Profile     64 y.o. male with past medical history of coronary artery disease, diabetes mellitus, atrial fibrillation admitted with atrial fibrillation with rapid ventricular response, acute pulmonary edema and chest pain.  Patient cardioverted emergently in the emergency room.  He has ruled in for myocardial infarction.  Echocardiogram shows ejection fraction 30 to 35%, mild right ventricular enlargement, biatrial enlargement.  Assessment & Plan    1 paroxysmal atrial fibrillation-patient underwent emergent cardioversion in the emergency room with improvement in his symptoms.  Post cath back in afib. On heparin now as may need more intervention will start amiodarone   2 non-ST elevation myocardial infarction-patient is pain-free today.  Troponin  peak 24,400 2 days ago. Cath with occluded OM stent and multiple lesions in previously stented RCA especially distally Anatomy not ideal for CABG or percutaneous intervention No intervention done Friday as PCWP was 35 and patient needed further diuresis to lay flat Continue heparin will try to introduce beta blocker for afib and MI BP too low for ACE/ARB/Entresto    3 CHF:  Ischemic EF 30-35% GDMT limited by PAF and hypotension continue diuresis Discussed possible need for AICD in future QRS not wide and CRT would not be beneficial   4 diabetes mellitus-follow CBGs.  For questions or updates, please contact [place] Please consult [email or web address] for contact info under        Signed,  Charlton Haws, MD  10/07/2019, 11:03 AM

## 2019-10-07 NOTE — Progress Notes (Signed)
ANTICOAGULATION CONSULT NOTE - Follow Up Consult  Pharmacy Consult for heparin Indication: Afib and NSTEMI  Labs: Recent Labs     0000 10/05/19 0108 10/05/19 0136 10/05/19 0143 10/05/19 0244 10/05/19 0254 10/05/19 0541 10/05/19 0729 10/05/19 1828 10/05/19 1828 10/06/19 0450 10/06/19 0450 10/06/19 1107 10/06/19 1107 10/06/19 1109 10/07/19 0506 10/07/19 1147  HGB   < > 14.9  --    < >  --    < >  --   --   --   --  14.9   < > 15.3   < > 15.3 14.0  --   HCT   < > 45.9  --    < >  --    < >  --   --   --   --  45.9   < > 45.0  --  45.0 44.3  --   PLT  --  226  --   --   --   --   --   --   --   --  159  --   --   --   --  158  --   APTT  --   --   --   --   --   --   --   --  49*  --  77*  --   --   --   --  107*  --   LABPROT  --   --  14.7  --   --   --   --   --   --   --   --   --   --   --   --   --   --   INR  --   --  1.2  --   --   --   --   --   --   --   --   --   --   --   --   --   --   HEPARINUNFRC  --   --   --   --   --   --   --   --  0.82*   < > 0.74*  --   --   --   --  0.67 0.74*  CREATININE   < > 1.36*  --    < > 1.49*  --   --   --   --   --  1.08  --   --   --   --  1.27* 1.27*  TROPONINIHS  --  100*  --    < > 803*  --  3,888* 24,409*  --   --   --   --   --   --   --   --   --    < > = values in this interval not displayed.    Assessment:  64yo male therapeutic on heparin after resumed post-cath and Afib. PTA apixaban - LD 5/12.   Heparin level 0.74 elevated. CBC stable and no bleeding or infusion issues per RN. Plan to transition back to apixaban once all procedures are complete  Plan: -Decrease heparin to 1550 units/hr -Check 6 hr HL @ 2000 -Daily HL/CBC -Monitor for s/sx of bleeding  Fabio Neighbors, PharmD PGY1 Ambulatory Care Resident [employer] # [phone]

## 2019-10-07 NOTE — Progress Notes (Signed)
ANTICOAGULATION CONSULT NOTE - Follow Up Consult  Pharmacy Consult for heparin Indication: Afib and NSTEMI  Labs: Recent Labs    10/05/19 0108 10/05/19 0136 10/05/19 0143 10/05/19 0244 10/05/19 0254 10/05/19 0541 10/05/19 0729 10/05/19 1828 10/06/19 0450 10/06/19 0450 10/06/19 1107 10/06/19 1107 10/06/19 1109 10/07/19 0506  HGB 14.9  --    < >  --    < >  --   --   --  14.9   < > 15.3   < > 15.3 14.0  HCT 45.9  --    < >  --    < >  --   --   --  45.9   < > 45.0  --  45.0 44.3  PLT 226  --   --   --   --   --   --   --  159  --   --   --   --  158  APTT  --   --   --   --   --   --   --  49* 77*  --   --   --   --  107*  LABPROT  --  14.7  --   --   --   --   --   --   --   --   --   --   --   --   INR  --  1.2  --   --   --   --   --   --   --   --   --   --   --   --   HEPARINUNFRC  --   --   --   --   --   --   --  0.82* 0.74*  --   --   --   --  0.67  CREATININE 1.36*  --    < > 1.49*  --   --   --   --  1.08  --   --   --   --  1.27*  TROPONINIHS 100*  --    < > 803*  --  3,888* 24,409*  --   --   --   --   --   --   --    < > = values in this interval not displayed.    Assessment/Plan:  65yo male therapeutic on heparin after resumed post-cath. Will continue gtt at current rate and confirm stable with additional level.   Vernard Gambles, PharmD, BCPS  10/07/2019,6:08 AM

## 2019-10-07 NOTE — Progress Notes (Signed)
ANTICOAGULATION CONSULT NOTE - Follow Up Consult  Pharmacy Consult for heparin Indication: Afib and NSTEMI  Labs: Recent Labs     0000 10/05/19 0108 10/05/19 0136 10/05/19 0143 10/05/19 0244 10/05/19 0254 10/05/19 0541 10/05/19 0729 10/05/19 1828 10/05/19 1828 10/06/19 0450 10/06/19 0450 10/06/19 1107 10/06/19 1107 10/06/19 1109 10/07/19 0506 10/07/19 1147 10/07/19 2008  HGB   < > 14.9  --    < >  --    < >  --   --   --   --  14.9   < > 15.3   < > 15.3 14.0  --   --   HCT   < > 45.9  --    < >  --    < >  --   --   --   --  45.9   < > 45.0  --  45.0 44.3  --   --   PLT  --  226  --   --   --   --   --   --   --   --  159  --   --   --   --  158  --   --   APTT  --   --   --   --   --   --   --   --  49*  --  77*  --   --   --   --  107*  --   --   LABPROT  --   --  14.7  --   --   --   --   --   --   --   --   --   --   --   --   --   --   --   INR  --   --  1.2  --   --   --   --   --   --   --   --   --   --   --   --   --   --   --   HEPARINUNFRC  --   --   --   --   --   --   --   --  0.82*   < > 0.74*   < >  --   --   --  0.67 0.74* 0.69  CREATININE   < > 1.36*  --    < > 1.49*  --   --   --   --   --  1.08  --   --   --   --  1.27* 1.27*  --   TROPONINIHS  --  100*  --    < > 803*  --  3,888* 24,409*  --   --   --   --   --   --   --   --   --   --    < > = values in this interval not displayed.    Assessment:  64yo male therapeutic on heparin after resumed post-cath and Afib. PTA apixaban - LD 5/12.   Heparin level 0.69 at top of range on heparin drip rate 1550 uts/hr . CBC stable and no bleeding or infusion issues per RN. Plan to transition back to apixaban once all procedures are complete  Plan: -Decrease heparin to 1350 units/hr -Daily HL/CBC -Monitor for s/sx of bleeding  Leota Sauers Pharm.D. CPP,  BCPS Clinical Pharmacist [phone] 10/07/2019 9:01 PM

## 2019-10-07 NOTE — Progress Notes (Signed)
PIV attempt  to left AC,  Unsuccessful, pressure dressing applied to site. No bleeding noted. Primary RN made aware.

## 2019-10-08 DIAGNOSIS — I209 Angina pectoris, unspecified: Secondary | ICD-10-CM

## 2019-10-08 LAB — CBC
HCT: 42 % (ref 39.0–52.0)
Hemoglobin: 13.4 g/dL (ref 13.0–17.0)
MCH: 29.6 pg (ref 26.0–34.0)
MCHC: 31.9 g/dL (ref 30.0–36.0)
MCV: 92.7 fL (ref 80.0–100.0)
Platelets: 154 10*3/uL (ref 150–400)
RBC: 4.53 MIL/uL (ref 4.22–5.81)
RDW: 13.4 % (ref 11.5–15.5)
WBC: 9.7 10*3/uL (ref 4.0–10.5)
nRBC: 0 % (ref 0.0–0.2)

## 2019-10-08 LAB — BASIC METABOLIC PANEL
Anion gap: 10 (ref 5–15)
BUN: 26 mg/dL — ABNORMAL HIGH (ref 8–23)
CO2: 31 mmol/L (ref 22–32)
Calcium: 8.6 mg/dL — ABNORMAL LOW (ref 8.9–10.3)
Chloride: 97 mmol/L — ABNORMAL LOW (ref 98–111)
Creatinine, Ser: 1.17 mg/dL (ref 0.61–1.24)
GFR calc Af Amer: >60 mL/min (ref >60)
GFR calc non Af Amer: >60 mL/min (ref >60)
Glucose, Bld: 140 mg/dL — ABNORMAL HIGH (ref 70–99)
Potassium: 4.1 mmol/L (ref 3.5–5.1)
Sodium: 138 mmol/L (ref 135–145)

## 2019-10-08 LAB — GLUCOSE, CAPILLARY
Glucose-Capillary: 138 mg/dL — ABNORMAL HIGH (ref 70–99)
Glucose-Capillary: 157 mg/dL — ABNORMAL HIGH (ref 70–99)
Glucose-Capillary: 103 mg/dL — ABNORMAL HIGH (ref 70–99)
Glucose-Capillary: 118 mg/dL — ABNORMAL HIGH (ref 70–99)

## 2019-10-08 LAB — HEPARIN LEVEL (UNFRACTIONATED)
Heparin Unfractionated: 0.53 IU/mL (ref 0.30–0.70)
Heparin Unfractionated: 0.49 IU/mL (ref 0.30–0.70)

## 2019-10-08 MED ORDER — SODIUM CHLORIDE 0.9% FLUSH: 3.0000 mL | INTRAVENOUS | Status: DC | PRN

## 2019-10-08 MED ORDER — SODIUM CHLORIDE 0.9 % IV SOLN: 250.0000 mL | INTRAVENOUS | Status: DC | PRN

## 2019-10-08 MED ORDER — SODIUM CHLORIDE 0.9% FLUSH
3.0000 mL | Freq: Two times a day (BID) | INTRAVENOUS | Status: DC
  Administered 2019-10-09 – 2019-10-11 (×3): 3 mL via INTRAVENOUS

## 2019-10-08 MED ORDER — SODIUM CHLORIDE 0.9 % IV SOLN: INTRAVENOUS | Status: DC

## 2019-10-08 MED ORDER — ASPIRIN 81 MG PO CHEW
81.0000 mg | CHEWABLE_TABLET | ORAL | Status: AC
  Administered 2019-10-09: 81 mg via ORAL
  Filled 2019-10-08: qty 1

## 2019-10-08 MED ORDER — FUROSEMIDE 10 MG/ML IJ SOLN
40.0000 mg | Freq: Two times a day (BID) | INTRAMUSCULAR | Status: DC
  Administered 2019-10-08 – 2019-10-10 (×5): 40 mg via INTRAVENOUS
  Filled 2019-10-08 (×5): qty 4

## 2019-10-08 NOTE — Progress Notes (Signed)
Progress Note  Patient Name: Carl Little Date of Encounter: 10/08/2019  Primary Cardiologist: Olga Millers, MD   Subjective   Had more PND/Orthopnea at 2:00 am no chest pain  He is working in [employer] for a year and a half but lives in [place] and has a Dr DeAngeloThere Sister Hope is POA lives in [place]  Inpatient Medications    Scheduled Meds: . aspirin EC  81 mg Oral Daily  . atorvastatin  80 mg Oral Daily  . canagliflozin  100 mg Oral QAC breakfast  . carvedilol  3.125 mg Oral BID WC  . chlorhexidine  15 mL Mouth Rinse BID  . furosemide  20 mg Oral BID  . hydrocortisone cream   Topical BID  . insulin aspart  0-15 Units Subcutaneous TID WC  . insulin aspart  0-5 Units Subcutaneous QHS  . mouth rinse  15 mL Mouth Rinse q12n4p  . sodium chloride flush  3 mL Intravenous Q12H  . sodium chloride flush  3 mL Intravenous Q12H  . tamsulosin  0.4 mg Oral Daily   Continuous Infusions: . sodium chloride    . sodium chloride    . amiodarone 30 mg/hr (10/08/19 0700)  . heparin 1,350 Units/hr (10/08/19 0700)   PRN Meds: sodium chloride, sodium chloride, acetaminophen, ondansetron (ZOFRAN) IV, sodium chloride flush, sodium chloride flush, zolpidem   Vital Signs    Vitals:   10/08/19 0042 10/08/19 0332 10/08/19 0700 10/08/19 0823  BP: 112/74 116/80  121/81  Pulse: (!) 55 71  73  Resp: 16 (!) 21    Temp: 98.2 F (36.8 C) 98 F (36.7 C)  (!) 97.4 F (36.3 C)  TempSrc: Oral Oral  Oral  SpO2: 98% 99%  100%  Weight:  113.6 kg 113.6 kg   Height:   5' 7.5" (1.715 m)     Intake/Output Summary (Last 24 hours) at 10/08/2019 0835 Last data filed at 10/08/2019 0700 Gross per 24 hour  Intake 1037.18 ml  Output 1800 ml  Net -762.82 ml   Last 3 Weights 10/08/2019 10/08/2019 10/07/2019  Weight (lbs) 250 lb 7.1 oz 250 lb 6.4 oz 250 lb  Weight (kg) 113.6 kg 113.581 kg 113.4 kg      Telemetry    Back in afib   Physical Exam   Affect appropriate Healthy:  appears stated  age HEENT: normal Neck supple with no adenopathy JVP normal no bruits no thyromegaly Lungs clear with no wheezing and good diaphragmatic motion Heart:  S1/S2 no murmur, no rub, gallop or click PMI normal Abdomen: benighn, BS positve, no tenderness, no AAA no bruit.  No HSM or HJR Distal pulses intact with no bruits Plus one bilateral edema Neuro non-focal Skin warm and dry No muscular weakness   Labs    High Sensitivity Troponin:   Recent Labs  Lab 10/05/19 0108 10/05/19 0244 10/05/19 0541 10/05/19 0729  TROPONINIHS 100* 803* 3,888* 24,409*      Chemistry Recent Labs  Lab 10/05/19 0108 10/05/19 0143 10/05/19 0244 10/05/19 0254 10/07/19 0506 10/07/19 1147 10/08/19 0410  NA 139   < > 137   < > 139 137 138  K 5.6*   < > 4.6   < > 4.2 4.3 4.1  CL 101   < > 101   < > 98 97* 97*  CO2 27   < > 20*   < > 29 28 31   GLUCOSE 203*   < > 227*   < > 100* 121* 140*  BUN 23   < > 24*   < > 26* 29* 26*  CREATININE 1.36*   < > 1.49*   < > 1.27* 1.27* 1.17  CALCIUM 8.9   < > 8.6*   < > 8.7* 8.9 8.6*  PROT 6.2*  --  6.6  --   --   --   --   ALBUMIN 3.5  --  3.9  --   --   --   --   AST 71*  --  66*  --   --   --   --   ALT 99*  --  99*  --   --   --   --   ALKPHOS 73  --  81  --   --   --   --   BILITOT 2.0*  --  1.6*  --   --   --   --   GFRNONAA 55*   < > 49*   < > 59* 59* >60  GFRAA >60   < > 57*   < > >60 >60 >60  ANIONGAP 11   < > 16*   < > 12 12 10    < > = values in this interval not displayed.     Hematology Recent Labs  Lab 10/06/19 0450 10/06/19 1107 10/06/19 1109 10/07/19 0506 10/08/19 0410  WBC 13.6*  --   --  10.8* 9.7  RBC 4.90  --   --  4.68 4.53  HGB 14.9   < > 15.3 14.0 13.4  HCT 45.9   < > 45.0 44.3 42.0  MCV 93.7  --   --  94.7 92.7  MCH 30.4  --   --  29.9 29.6  MCHC 32.5  --   --  31.6 31.9  RDW 14.2  --   --  13.8 13.4  PLT 159  --   --  158 154   < > = values in this interval not displayed.    BNP Recent Labs  Lab 10/05/19 0244  BNP  428.3*     Radiology    CARDIAC CATHETERIZATION  Result Date: 10/06/2019  Severe native vessel coronary disease with anatomically small left anterior descending and the anterior interventricular groove with much of the anterior wall territory supplied by a large branching diagonal.  Right dominant anatomy with the right coronary containing a proximal stent, mid to distal stent, and PDA stent.  Moderate distal in-stent restenosis involving the proximal segment, the nonstented mid segment contains diffuse 60% narrowing with 99% proximal margin in-stent restenosis, and 99% in-stent restenosis of the PDA stent.  The PDA does reach the left ventricular apex but was difficult to completely visualize due to respiratory disengagement of the diagnostic catheter.  Multiple catheters were used.  Circumflex contains a long stented segment from the mid circumflex into a large second obtuse marginal that is totally occluded.  A smaller second obtuse marginal is jailed by the stent and contains ostial 99% stenosis with slow flow.  Vessel diameter is small.  The larger stented branch fills faintly by left to left collaterals.  The LAD contains no significant obstruction before the origin of the large diagonal.  The diagonal contains perhaps 30 to 40% ostial narrowing.  The LAD beyond the origin of the diagonal contains 80% stenosis.  The LAD proper is small.  Moderate to severe pulmonary hypertension  Mean pulmonary capillary wedge pressure 34 mmHg  Left ventricular end-diastolic pressure 28 mmHg.  Consistent with  acute on chronic combined systolic and diastolic heart failure.  Patient is unable to lie flat today for the procedure but was done with him sitting at 50 degrees and breathing high flow oxygen to maintain O2 sats greater than 95% RECOMMENDATIONS:  First order of business is management of volume overload with aggressive diuresis and institution of guideline directed therapy for heart failure.  Whether or  not revascularization should be attempted should be debated by the interventional team.  The right coronary is a target but each of 3 stents has significant in-stent restenosis, therefore long-term patency would be questionable.  The circumflex is totally occluded with poor collaterals.  The LAD is anatomically small but without high-grade obstruction.  This anatomy does not seem to be very conducive to surgical revascularization.  Concerned about prognosis in this patient given the anatomic substrate, diffuse in-stent restenosis throughout the right coronary and total occlusion and long stents placed in the circumflex.  Resume IV heparin.  Early next week, need to determine if PCI is a reasonable revascularization approach after heart failure is controlled.  If PCI is performed, it should be from the femoral approach due to difficulty with catheter engagement of the right coronary.   Patient Profile     63 y.o. male with past medical history of coronary artery disease, diabetes mellitus, atrial fibrillation admitted with atrial fibrillation with rapid ventricular response, acute pulmonary edema and chest pain.  Patient cardioverted emergently in the emergency room.  He has ruled in for myocardial infarction.  Echocardiogram shows ejection fraction 30 to 35%, mild right ventricular enlargement, biatrial enlargement.  Assessment & Plan    1 paroxysmal atrial fibrillation-patient underwent emergent cardioversion in the emergency room with improvement in his symptoms.  Post cath back in afib. On heparin now as may need more intervention amiodarone started 5/15  2 non-ST elevation myocardial infarction-patient is pain-free today.  Troponin peak 24,400 2 days ago. Cath with occluded OM stent and multiple lesions in previously stented RCA especially distally Anatomy not ideal for CABG or Change to iv lasix 40 mg bid Had > 700 cc out but still PND/Orthopnea last night  Continue heparin started coreg 5/15  tolerating  for afib and MI BP too low for ACE/ARB/Entresto    3 CHF:  Ischemic EF 30-35% GDMT limited by PAF and hypotension continue diuresis Discussed possible need for AICD in future QRS not wide and CRT would not be beneficial   4 diabetes mellitus-follow CBGs.  I have notified interventionalists of decision needs. Will be on Dr Smiths service in am will make NPO and tentatively put on schedule for intervention in afternoon with Dr End/Jordan  For questions or updates, please contact [facility] Please consult [email or web address] for contact info under        Signed, Ashana Tullo, MD  10/08/2019, 8:35 AM    

## 2019-10-08 NOTE — Progress Notes (Signed)
Pt refused the Bipap.

## 2019-10-08 NOTE — H&P (View-Only) (Signed)
Progress Note  Patient Name: Carl Little Date of Encounter: 10/08/2019  Primary Cardiologist: Olga Millers, MD   Subjective   Had more PND/Orthopnea at 2:00 am no chest pain  He is working in [employer] for a year and a half but lives in [place] and has a Dr DeAngeloThere Sister Hope is POA lives in [place]  Inpatient Medications    Scheduled Meds: . aspirin EC  81 mg Oral Daily  . atorvastatin  80 mg Oral Daily  . canagliflozin  100 mg Oral QAC breakfast  . carvedilol  3.125 mg Oral BID WC  . chlorhexidine  15 mL Mouth Rinse BID  . furosemide  20 mg Oral BID  . hydrocortisone cream   Topical BID  . insulin aspart  0-15 Units Subcutaneous TID WC  . insulin aspart  0-5 Units Subcutaneous QHS  . mouth rinse  15 mL Mouth Rinse q12n4p  . sodium chloride flush  3 mL Intravenous Q12H  . sodium chloride flush  3 mL Intravenous Q12H  . tamsulosin  0.4 mg Oral Daily   Continuous Infusions: . sodium chloride    . sodium chloride    . amiodarone 30 mg/hr (10/08/19 0700)  . heparin 1,350 Units/hr (10/08/19 0700)   PRN Meds: sodium chloride, sodium chloride, acetaminophen, ondansetron (ZOFRAN) IV, sodium chloride flush, sodium chloride flush, zolpidem   Vital Signs    Vitals:   10/08/19 0042 10/08/19 0332 10/08/19 0700 10/08/19 0823  BP: 112/74 116/80  121/81  Pulse: (!) 55 71  73  Resp: 16 (!) 21    Temp: 98.2 F (36.8 C) 98 F (36.7 C)  (!) 97.4 F (36.3 C)  TempSrc: Oral Oral  Oral  SpO2: 98% 99%  100%  Weight:  113.6 kg 113.6 kg   Height:   5' 7.5" (1.715 m)     Intake/Output Summary (Last 24 hours) at 10/08/2019 0835 Last data filed at 10/08/2019 0700 Gross per 24 hour  Intake 1037.18 ml  Output 1800 ml  Net -762.82 ml   Last 3 Weights 10/08/2019 10/08/2019 10/07/2019  Weight (lbs) 250 lb 7.1 oz 250 lb 6.4 oz 250 lb  Weight (kg) 113.6 kg 113.581 kg 113.4 kg      Telemetry    Back in afib   Physical Exam   Affect appropriate Healthy:  appears stated  age HEENT: normal Neck supple with no adenopathy JVP normal no bruits no thyromegaly Lungs clear with no wheezing and good diaphragmatic motion Heart:  S1/S2 no murmur, no rub, gallop or click PMI normal Abdomen: benighn, BS positve, no tenderness, no AAA no bruit.  No HSM or HJR Distal pulses intact with no bruits Plus one bilateral edema Neuro non-focal Skin warm and dry No muscular weakness   Labs    High Sensitivity Troponin:   Recent Labs  Lab 10/05/19 0108 10/05/19 0244 10/05/19 0541 10/05/19 0729  TROPONINIHS 100* 803* 3,888* 24,409*      Chemistry Recent Labs  Lab 10/05/19 0108 10/05/19 0143 10/05/19 0244 10/05/19 0254 10/07/19 0506 10/07/19 1147 10/08/19 0410  NA 139   < > 137   < > 139 137 138  K 5.6*   < > 4.6   < > 4.2 4.3 4.1  CL 101   < > 101   < > 98 97* 97*  CO2 27   < > 20*   < > 29 28 31   GLUCOSE 203*   < > 227*   < > 100* 121* 140*  BUN 23   < > 24*   < > 26* 29* 26*  CREATININE 1.36*   < > 1.49*   < > 1.27* 1.27* 1.17  CALCIUM 8.9   < > 8.6*   < > 8.7* 8.9 8.6*  PROT 6.2*  --  6.6  --   --   --   --   ALBUMIN 3.5  --  3.9  --   --   --   --   AST 71*  --  66*  --   --   --   --   ALT 99*  --  99*  --   --   --   --   ALKPHOS 73  --  81  --   --   --   --   BILITOT 2.0*  --  1.6*  --   --   --   --   GFRNONAA 55*   < > 49*   < > 59* 59* >60  GFRAA >60   < > 57*   < > >60 >60 >60  ANIONGAP 11   < > 16*   < > 12 12 10    < > = values in this interval not displayed.     Hematology Recent Labs  Lab 10/06/19 0450 10/06/19 1107 10/06/19 1109 10/07/19 0506 10/08/19 0410  WBC 13.6*  --   --  10.8* 9.7  RBC 4.90  --   --  4.68 4.53  HGB 14.9   < > 15.3 14.0 13.4  HCT 45.9   < > 45.0 44.3 42.0  MCV 93.7  --   --  94.7 92.7  MCH 30.4  --   --  29.9 29.6  MCHC 32.5  --   --  31.6 31.9  RDW 14.2  --   --  13.8 13.4  PLT 159  --   --  158 154   < > = values in this interval not displayed.    BNP Recent Labs  Lab 10/05/19 0244  BNP  428.3*     Radiology    CARDIAC CATHETERIZATION  Result Date: 10/06/2019  Severe native vessel coronary disease with anatomically small left anterior descending and the anterior interventricular groove with much of the anterior wall territory supplied by a large branching diagonal.  Right dominant anatomy with the right coronary containing a proximal stent, mid to distal stent, and PDA stent.  Moderate distal in-stent restenosis involving the proximal segment, the nonstented mid segment contains diffuse 60% narrowing with 99% proximal margin in-stent restenosis, and 99% in-stent restenosis of the PDA stent.  The PDA does reach the left ventricular apex but was difficult to completely visualize due to respiratory disengagement of the diagnostic catheter.  Multiple catheters were used.  Circumflex contains a long stented segment from the mid circumflex into a large second obtuse marginal that is totally occluded.  A smaller second obtuse marginal is jailed by the stent and contains ostial 99% stenosis with slow flow.  Vessel diameter is small.  The larger stented branch fills faintly by left to left collaterals.  The LAD contains no significant obstruction before the origin of the large diagonal.  The diagonal contains perhaps 30 to 40% ostial narrowing.  The LAD beyond the origin of the diagonal contains 80% stenosis.  The LAD proper is small.  Moderate to severe pulmonary hypertension  Mean pulmonary capillary wedge pressure 34 mmHg  Left ventricular end-diastolic pressure 28 mmHg.  Consistent with  acute on chronic combined systolic and diastolic heart failure.  Patient is unable to lie flat today for the procedure but was done with him sitting at 50 degrees and breathing high flow oxygen to maintain O2 sats greater than 95% RECOMMENDATIONS:  First order of business is management of volume overload with aggressive diuresis and institution of guideline directed therapy for heart failure.  Whether or  not revascularization should be attempted should be debated by the interventional team.  The right coronary is a target but each of 3 stents has significant in-stent restenosis, therefore long-term patency would be questionable.  The circumflex is totally occluded with poor collaterals.  The LAD is anatomically small but without high-grade obstruction.  This anatomy does not seem to be very conducive to surgical revascularization.  Concerned about prognosis in this patient given the anatomic substrate, diffuse in-stent restenosis throughout the right coronary and total occlusion and long stents placed in the circumflex.  Resume IV heparin.  Early next week, need to determine if PCI is a reasonable revascularization approach after heart failure is controlled.  If PCI is performed, it should be from the femoral approach due to difficulty with catheter engagement of the right coronary.   Patient Profile     64 y.o. male with past medical history of coronary artery disease, diabetes mellitus, atrial fibrillation admitted with atrial fibrillation with rapid ventricular response, acute pulmonary edema and chest pain.  Patient cardioverted emergently in the emergency room.  He has ruled in for myocardial infarction.  Echocardiogram shows ejection fraction 30 to 35%, mild right ventricular enlargement, biatrial enlargement.  Assessment & Plan    1 paroxysmal atrial fibrillation-patient underwent emergent cardioversion in the emergency room with improvement in his symptoms.  Post cath back in afib. On heparin now as may need more intervention amiodarone started 5/15  2 non-ST elevation myocardial infarction-patient is pain-free today.  Troponin peak 24,400 2 days ago. Cath with occluded OM stent and multiple lesions in previously stented RCA especially distally Anatomy not ideal for CABG or Change to iv lasix 40 mg bid Had > 700 cc out but still PND/Orthopnea last night  Continue heparin started coreg 5/15  tolerating  for afib and MI BP too low for ACE/ARB/Entresto    3 CHF:  Ischemic EF 30-35% GDMT limited by PAF and hypotension continue diuresis Discussed possible need for AICD in future QRS not wide and CRT would not be beneficial   4 diabetes mellitus-follow CBGs.  I have notified interventionalists of decision needs. Will be on Dr Lonn Georgia service in am will make NPO and tentatively put on schedule for intervention in afternoon with Dr Charlena Cross  For questions or updates, please contact [facility] Please consult [email or web address] for contact info under        Signed, Charlton Haws, MD  10/08/2019, 8:35 AM

## 2019-10-08 NOTE — Progress Notes (Addendum)
ANTICOAGULATION CONSULT NOTE - Follow Up Consult  Pharmacy Consult for heparin Indication: Afib and NSTEMI  No Known Allergies  Patient Measurements: Height: 5' 7.5" (171.5 cm) Weight: 113.6 kg (250 lb 6.4 oz) IBW/kg (Calculated) : 67.25 Heparin Dosing Weight: 92.9 kg  Vital Signs: Temp: 98 F (36.7 C) (05/16 0332) Temp Source: Oral (05/16 0332) BP: 116/80 (05/16 0332) Pulse Rate: 71 (05/16 0332)  Labs: Recent Labs    10/05/19 1828 10/05/19 1828 10/06/19 0450 10/06/19 0450 10/06/19 1107 10/06/19 1109 10/07/19 0506 10/07/19 0506 10/07/19 1147 10/07/19 2008 10/08/19 0410  HGB  --   --  14.9   < >   < > 15.3 14.0  --   --   --  13.4  HCT  --   --  45.9  --    < > 45.0 44.3  --   --   --  42.0  PLT  --   --  159  --   --   --  158  --   --   --  154  APTT 49*  --  77*  --   --   --  107*  --   --   --   --   HEPARINUNFRC 0.82*   < > 0.74*   < >  --   --  0.67   < > 0.74* 0.69 0.53  CREATININE  --   --  1.08   < >  --   --  1.27*  --  1.27*  --  1.17   < > = values in this interval not displayed.    Estimated Creatinine Clearance: 77.4 mL/min (by C-G formula based on SCr of 1.17 mg/dL).   Medications:  Scheduled:  . aspirin EC  81 mg Oral Daily  . atorvastatin  80 mg Oral Daily  . canagliflozin  100 mg Oral QAC breakfast  . carvedilol  3.125 mg Oral BID WC  . chlorhexidine  15 mL Mouth Rinse BID  . furosemide  20 mg Oral BID  . hydrocortisone cream   Topical BID  . insulin aspart  0-15 Units Subcutaneous TID WC  . insulin aspart  0-5 Units Subcutaneous QHS  . mouth rinse  15 mL Mouth Rinse q12n4p  . sodium chloride flush  3 mL Intravenous Q12H  . sodium chloride flush  3 mL Intravenous Q12H  . tamsulosin  0.4 mg Oral Daily   Infusions:  . sodium chloride    . sodium chloride    . amiodarone 30 mg/hr (10/08/19 0700)  . heparin 1,350 Units/hr (10/08/19 0700)    Assessment: 64 yr old male presented to the ED with CP and elevated troponin (NSTEMI). He is  on chronic apixaban for history of afib (last dose was yesterday, 10/04/19. Heparin resumed after post-cath and Afib. Pharmacy consulted for Heparin dosing.   Heparin level 0.53 is therapeutic this AM with adjusted heparin rate of 1350 units/hr. CBC stable and no bleeding or infusion issues per RN. Plan to transition back to apixaban once all procedures are complete  Addendum: Confirmatory HL therapeutic at 0.49 on 1350 units/hr.   Goal of Therapy:  Heparin level 0.3-0.7 units/ml Monitor platelets by anticoagulation protocol: Yes   Plan:  Continue Heparin at 1350 units/hr Daily HL/CBC Monitor for s/sx of bleeding  Fabio Neighbors, PharmD PGY1 Ambulatory Care Resident [employer] # [phone]

## 2019-10-09 ENCOUNTER — Encounter (HOSPITAL_COMMUNITY)
Admission: EM | Disposition: A | Discharge status: Home / Self Care | Payer: Self-pay | Source: Home / Self Care | Attending: Cardiology

## 2019-10-09 DIAGNOSIS — I214 Non-ST elevation (NSTEMI) myocardial infarction: Secondary | ICD-10-CM

## 2019-10-09 HISTORY — PX: CORONARY BALLOON ANGIOPLASTY: CATH118233

## 2019-10-09 HISTORY — PX: CORONARY STENT INTERVENTION: CATH118234

## 2019-10-09 LAB — GLUCOSE, CAPILLARY
Glucose-Capillary: 108 mg/dL — ABNORMAL HIGH (ref 70–99)
Glucose-Capillary: 115 mg/dL — ABNORMAL HIGH (ref 70–99)
Glucose-Capillary: 146 mg/dL — ABNORMAL HIGH (ref 70–99)
Glucose-Capillary: 177 mg/dL — ABNORMAL HIGH (ref 70–99)

## 2019-10-09 LAB — HEPARIN LEVEL (UNFRACTIONATED): Heparin Unfractionated: 0.4 IU/mL (ref 0.30–0.70)

## 2019-10-09 LAB — BASIC METABOLIC PANEL
Anion gap: 14 (ref 5–15)
BUN: 21 mg/dL (ref 8–23)
CO2: 30 mmol/L (ref 22–32)
Calcium: 8.5 mg/dL — ABNORMAL LOW (ref 8.9–10.3)
Chloride: 93 mmol/L — ABNORMAL LOW (ref 98–111)
Creatinine, Ser: 1.29 mg/dL — ABNORMAL HIGH (ref 0.61–1.24)
GFR calc Af Amer: >60 mL/min (ref >60)
GFR calc non Af Amer: 58 mL/min — ABNORMAL LOW (ref >60)
Glucose, Bld: 175 mg/dL — ABNORMAL HIGH (ref 70–99)
Potassium: 3.6 mmol/L (ref 3.5–5.1)
Sodium: 137 mmol/L (ref 135–145)

## 2019-10-09 LAB — CBC
HCT: 41.9 % (ref 39.0–52.0)
Hemoglobin: 13.5 g/dL (ref 13.0–17.0)
MCH: 29.7 pg (ref 26.0–34.0)
MCHC: 32.2 g/dL (ref 30.0–36.0)
MCV: 92.3 fL (ref 80.0–100.0)
Platelets: 151 10*3/uL (ref 150–400)
RBC: 4.54 MIL/uL (ref 4.22–5.81)
RDW: 13.3 % (ref 11.5–15.5)
WBC: 7.5 10*3/uL (ref 4.0–10.5)
nRBC: 0 % (ref 0.0–0.2)

## 2019-10-09 LAB — POCT ACTIVATED CLOTTING TIME
Activated Clotting Time: 676 seconds
Activated Clotting Time: 522 seconds

## 2019-10-09 SURGERY — CORONARY STENT INTERVENTION
Anesthesia: LOCAL

## 2019-10-09 MED ORDER — ONDANSETRON HCL 4 MG/2ML IJ SOLN: 4.0000 mg | Freq: Four times a day (QID) | INTRAMUSCULAR | Status: DC | PRN

## 2019-10-09 MED ORDER — LABETALOL HCL 5 MG/ML IV SOLN: 10.0000 mg | INTRAVENOUS | Status: AC | PRN

## 2019-10-09 MED ORDER — LIDOCAINE HCL (PF) 1 % IJ SOLN
INTRAMUSCULAR | Status: AC
  Filled 2019-10-09: qty 30

## 2019-10-09 MED ORDER — FENTANYL CITRATE (PF) 100 MCG/2ML IJ SOLN
INTRAMUSCULAR | Status: AC
  Filled 2019-10-09: qty 2

## 2019-10-09 MED ORDER — HYDRALAZINE HCL 20 MG/ML IJ SOLN: 10.0000 mg | INTRAMUSCULAR | Status: AC | PRN

## 2019-10-09 MED ORDER — SODIUM CHLORIDE 0.9% FLUSH
3.0000 mL | Freq: Two times a day (BID) | INTRAVENOUS | Status: DC
  Administered 2019-10-09 – 2019-10-10 (×3): 3 mL via INTRAVENOUS

## 2019-10-09 MED ORDER — MIDAZOLAM HCL 2 MG/2ML IJ SOLN
INTRAMUSCULAR | Status: AC
  Filled 2019-10-09: qty 2

## 2019-10-09 MED ORDER — MIDAZOLAM HCL 2 MG/2ML IJ SOLN
INTRAMUSCULAR | Status: DC | PRN
  Administered 2019-10-09 (×2): 1 mg via INTRAVENOUS

## 2019-10-09 MED ORDER — CLOPIDOGREL BISULFATE 75 MG PO TABS
300.0000 mg | ORAL_TABLET | Freq: Once | ORAL | Status: AC
  Administered 2019-10-09: 300 mg via ORAL
  Filled 2019-10-09: qty 4

## 2019-10-09 MED ORDER — CLOPIDOGREL BISULFATE 75 MG PO TABS
75.0000 mg | ORAL_TABLET | Freq: Every day | ORAL | Status: DC
  Administered 2019-10-10 – 2019-10-11 (×2): 75 mg via ORAL
  Filled 2019-10-09 (×2): qty 1

## 2019-10-09 MED ORDER — VERAPAMIL HCL 2.5 MG/ML IV SOLN
INTRAVENOUS | Status: AC
  Filled 2019-10-09: qty 2

## 2019-10-09 MED ORDER — HEPARIN (PORCINE) IN NACL 1000-0.9 UT/500ML-% IV SOLN
INTRAVENOUS | Status: AC
  Filled 2019-10-09: qty 1000

## 2019-10-09 MED ORDER — SODIUM CHLORIDE 0.9% FLUSH: 3.0000 mL | INTRAVENOUS | Status: DC | PRN

## 2019-10-09 MED ORDER — HEPARIN SODIUM (PORCINE) 1000 UNIT/ML IJ SOLN
INTRAMUSCULAR | Status: DC | PRN
  Administered 2019-10-09: 10000 [IU] via INTRAVENOUS

## 2019-10-09 MED ORDER — LIDOCAINE HCL (PF) 1 % IJ SOLN
INTRAMUSCULAR | Status: DC | PRN
  Administered 2019-10-09: 2 mL via SUBCUTANEOUS

## 2019-10-09 MED ORDER — HEPARIN (PORCINE) IN NACL 1000-0.9 UT/500ML-% IV SOLN
INTRAVENOUS | Status: DC | PRN
  Administered 2019-10-09 (×2): 500 mL

## 2019-10-09 MED ORDER — IOHEXOL 350 MG/ML SOLN
INTRAVENOUS | Status: DC | PRN
  Administered 2019-10-09: 80 mL via INTRA_ARTERIAL

## 2019-10-09 MED ORDER — ACETAMINOPHEN 325 MG PO TABS: 650.0000 mg | ORAL_TABLET | ORAL | Status: DC | PRN

## 2019-10-09 MED ORDER — FENTANYL CITRATE (PF) 100 MCG/2ML IJ SOLN
INTRAMUSCULAR | Status: DC | PRN
  Administered 2019-10-09 (×2): 25 ug via INTRAVENOUS

## 2019-10-09 MED ORDER — VERAPAMIL HCL 2.5 MG/ML IV SOLN
INTRAVENOUS | Status: DC | PRN
  Administered 2019-10-09: 10 mL via INTRA_ARTERIAL

## 2019-10-09 MED ORDER — HEPARIN SODIUM (PORCINE) 1000 UNIT/ML IJ SOLN
INTRAMUSCULAR | Status: AC
  Filled 2019-10-09: qty 1

## 2019-10-09 MED ORDER — POTASSIUM CHLORIDE CRYS ER 20 MEQ PO TBCR
40.0000 meq | EXTENDED_RELEASE_TABLET | Freq: Once | ORAL | Status: AC
  Administered 2019-10-09: 40 meq via ORAL
  Filled 2019-10-09: qty 2

## 2019-10-09 MED ORDER — SODIUM CHLORIDE 0.9 % IV SOLN: 250.0000 mL | INTRAVENOUS | Status: DC | PRN

## 2019-10-09 SURGICAL SUPPLY — 23 items
BALLN EMERGE MR 2.5X12 (BALLOONS) ×2
BALLN ~~LOC~~ EMERGE MR 3.5X20 (BALLOONS) ×2
BALLOON EMERGE MR 2.5X12 (BALLOONS) IMPLANT
BALLOON ~~LOC~~ EMERGE MR 3.5X20 (BALLOONS) IMPLANT
CATH LAUNCHER 6FR AL1 (CATHETERS) IMPLANT
CATH OPTICROSS HD (CATHETERS) ×1 IMPLANT
CATHETER LAUNCHER 6FR AL1 (CATHETERS) ×2
DEVICE RAD COMP TR BAND LRG (VASCULAR PRODUCTS) ×1 IMPLANT
ELECT DEFIB PAD ADLT CADENCE (PAD) ×1 IMPLANT
GLIDESHEATH SLEND SS 6F .021 (SHEATH) ×1 IMPLANT
GUIDEWIRE INQWIRE 1.5J.035X260 (WIRE) IMPLANT
INQWIRE 1.5J .035X260CM (WIRE) ×2
KIT ENCORE 26 ADVANTAGE (KITS) ×1 IMPLANT
KIT HEART LEFT (KITS) ×2 IMPLANT
PACK CARDIAC CATHETERIZATION (CUSTOM PROCEDURE TRAY) ×2 IMPLANT
SHEATH PINNACLE 6F 10CM (SHEATH) IMPLANT
SLED PULL BACK IVUS (MISCELLANEOUS) ×1 IMPLANT
STENT RESOLUTE ONYX 2.75X22 (Permanent Stent) ×1 IMPLANT
STENT RESOLUTE ONYX3.0X38 (Permanent Stent) ×1 IMPLANT
TRANSDUCER W/STOPCOCK (MISCELLANEOUS) ×2 IMPLANT
TUBING CIL FLEX 10 FLL-RA (TUBING) ×2 IMPLANT
WIRE ASAHI PROWATER 180CM (WIRE) ×1 IMPLANT
WIRE EMERALD 3MM-J .035X150CM (WIRE) IMPLANT

## 2019-10-09 NOTE — Progress Notes (Signed)
Heart Failure Stewardship Pharmacist Progress Note   PCP: System, Pcp Not In PCP-Cardiologist: Olga Millers, MD    HPI:  64 yo M with PMH significant for CAD (s/p PCI of RCA and LCx), diabetes mellitus, and atrial fibrillation admitted on 5/13 for afib with RVR, acute pulmonary edema, and chest pain.   He was then cardioverted x 2 in the ED to sinus rhythm with improved ventricular rate.  ECHO on 10/05/19 with newly reduced LV dysfunction - LVEF 30-35%.  He has been receiving IV diuretics for volume overload during admission. Weight down 18 lbs since admission  Right and left heart catheterization completed 10/06/19. Found to have severe native vessel disease with moderate distal in-stent restenosis in RCA. LCx occluded with poor collaterals. No interventions/PCI completed pending further discussion with interventional +/- surgical team. Moderate pulmonary hypertension: RA 15, PA 44, PCWP 34, LVEDP 27, CO/CI 4.32/1.88.  Scheduled for LHC today for possible intervention.  Current HF Medications: Furosemide 40 mg IV BID Carvedilol 3.125 mg BID Invokana 100 mg (substitiution for [place])  Prior to admission HF Medications: Metoprolol succinate 50 mg daily Lisinopril 40 mg daily Farxiga 5 mg daily  Pertinent Lab Values: . Serum creatinine 1.29, BUN 21, Potassium 3.6, Sodium 137, BNP 428.3   Vital Signs: . Weight: 245 lbs (estimated dry weight: unkown) . Blood pressure: 120/70s  . Heart rate: 60-70s   Medication Assistance / Insurance Benefits Check: Does the patient have prescription insurance?   yes Type of insurance plan: commercial - [employer]  Does the patient qualify for medication assistance through manufacturers or grants?   yes  Eligible grants and/or patient assistance programs: pending  Medication assistance applications in progress: None  Medication assistance applications approved: None  Approved medication assistance renewals will be completed by: Dr.  Ludwig Clarks office  Outpatient Pharmacy:  [facility] Is the patient willing to use [facility] TOC pharmacy at discharge? Yes - pharmacy has been updated Is the patient willing to transition their outpatient pharmacy to utilize a [facility] outpatient pharmacy?   TBD    Assessment: 1. Acute systolic CHF (EF 34-19%), ischemic in etiology. L/RHC completed 5/14 with severe native vessel disease, in-stent restenosis, and moderate pulmonary hypertension. NYHA class II symptoms. Volume improved on MD exam. Repeat LHC today. - Continue furosemide 40 mg IV BID - Continue carvedilol 3.125 mg BID - Consider starting Entresto 24/26 mg BID after cath - last dose of lisinopril was 10/04/19. - Consider adding spironolactone prior to discharge - Continue Invokana (substitution for [place]).    Plan: 1) Medication changes recommended at this time: - None - will hold off adding Entresto at this time with planned LHC today.  2) Patient assistance application(s): - None - Entresto would be $40 for 30 days or $100 for 90 days through [employer]. Can provide 30-day free card and copay card to bring copay down to $10 per month.  3)  Education  - To be completed prior to discharge  Danae Orleans, PharmD, BCPS Heart Failure Stewardship Pharmacist Phone [phone] 10/09/2019       10:06 AM

## 2019-10-09 NOTE — Progress Notes (Signed)
Chaplain responded to consult for AD. The patient desired to complete the paperwork prior to a procedure. The chaplain was able to complete the paperwork and left the signed original and a copy on the bedside table. The chaplain is available to follow-up if needed.  Lavone Neri Chaplain Resident For questions concerning this note please contact me by pager [phone]

## 2019-10-09 NOTE — Progress Notes (Signed)
ANTICOAGULATION CONSULT NOTE - Follow Up Consult  Pharmacy Consult for heparin Indication: Afib and NSTEMI  Labs: Recent Labs    10/07/19 0506 10/07/19 0506 10/07/19 1147 10/07/19 2008 10/08/19 0410 10/08/19 1232 10/09/19 0630  HGB 14.0   < >  --   --  13.4  --  13.5  HCT 44.3  --   --   --  42.0  --  41.9  PLT 158  --   --   --  154  --  151  APTT 107*  --   --   --   --   --   --   HEPARINUNFRC 0.67   < > 0.74*   < > 0.53 0.49 0.40  CREATININE 1.27*   < > 1.27*  --  1.17  --  1.29*   < > = values in this interval not displayed.    Assessment:  64yo male therapeutic on heparin after resumed post-cath and Afib. PTA apixaban - LD 5/12. Plans for cardiac cath today -heparin level at goal   Plan: -Continue heparin at 1350 units/hr -Daily HL/CBC   Harland German, PharmD Clinical Pharmacist **Pharmacist phone directory can now be found on [email or web address] ([email or web address]).  Listed under [facility].

## 2019-10-09 NOTE — Interval H&P Note (Signed)
History and Physical Interval Note:  10/09/2019 11:35 AM  Carl Little  has presented today for surgery, with the diagnosis of NSTEMI.  The various methods of treatment have been discussed with the patient and family. After consideration of risks, benefits and other options for treatment, the patient has consented to  Procedure(s): LEFT HEART CATH AND CORONARY ANGIOGRAPHY (N/A) as a surgical intervention.  The patient's history has been reviewed, patient examined, no change in status, stable for surgery.  I have reviewed the patient's chart and labs.  Questions were answered to the patient's satisfaction.   Cath Lab Visit (complete for each Cath Lab visit)  Clinical Evaluation Leading to the Procedure:   ACS: Yes.    Non-ACS:    Anginal Classification: CCS IV  Anti-ischemic medical therapy: Minimal Therapy (1 class of medications)  Non-Invasive Test Results: No non-invasive testing performed  Prior CABG: No previous CABG        [facility] 10/09/2019 11:35 AM

## 2019-10-09 NOTE — Progress Notes (Signed)
Progress Note  Patient Name: Carl Little Date of Encounter: 10/09/2019  Primary Cardiologist: Olga Millers, MD   Subjective   No acute overnight events. Still on 3L via nasal cannula but breathing much better. No orthopnea or PND. No chest pain, palpitations, lightheadedness, or dizziness.   Inpatient Medications    Scheduled Meds: . aspirin EC  81 mg Oral Daily  . atorvastatin  80 mg Oral Daily  . canagliflozin  100 mg Oral QAC breakfast  . carvedilol  3.125 mg Oral BID WC  . chlorhexidine  15 mL Mouth Rinse BID  . furosemide  40 mg Intravenous BID  . hydrocortisone cream   Topical BID  . insulin aspart  0-15 Units Subcutaneous TID WC  . insulin aspart  0-5 Units Subcutaneous QHS  . mouth rinse  15 mL Mouth Rinse q12n4p  . sodium chloride flush  3 mL Intravenous Q12H  . sodium chloride flush  3 mL Intravenous Q12H  . sodium chloride flush  3 mL Intravenous Q12H  . tamsulosin  0.4 mg Oral Daily   Continuous Infusions: . sodium chloride    . sodium chloride    . sodium chloride    . sodium chloride 10 mL/hr at 10/09/19 0700  . amiodarone 30 mg/hr (10/09/19 0700)  . heparin 1,350 Units/hr (10/09/19 0700)   PRN Meds: sodium chloride, sodium chloride, sodium chloride, acetaminophen, ondansetron (ZOFRAN) IV, sodium chloride flush, sodium chloride flush, sodium chloride flush, zolpidem   Vital Signs    Vitals:   10/08/19 1216 10/08/19 2117 10/09/19 0002 10/09/19 0552  BP:  115/77 116/70 (!) 141/73  Pulse: 88 75 66 81  Resp:  18 19 16   Temp:  98.1 F (36.7 C) 98.1 F (36.7 C) 97.9 F (36.6 C)  TempSrc:  Oral Oral Oral  SpO2: 100% 99% 98% 98%  Weight:    111.2 kg  Height:        Intake/Output Summary (Last 24 hours) at 10/09/2019 0732 Last data filed at 10/09/2019 0700 Gross per 24 hour  Intake 1332.43 ml  Output 3925 ml  Net -2592.57 ml   Last 3 Weights 10/09/2019 10/08/2019 10/08/2019  Weight (lbs) 245 lb 3.2 oz 250 lb 7.1 oz 250 lb 6.4 oz  Weight (kg)  111.222 kg 113.6 kg 113.581 kg      Telemetry    Atrial fibrillation with slow ventricular rate at times. A few pauses, longest 2.34 seconds. One 8 beat run of non-sustained VT. - Personally Reviewed  ECG    No new ECG tracing today. - Personally Reviewed  Physical Exam   GEN: No acute distress.   Neck: Supple. No JVD Cardiac: Irregular irregularly present with normal rate. No murmurs, rubs, or gallops. Radial and distal pedal pulses 2+ and equal bilaterally. Right radial cath site soft with no signs of hematoma. Respiratory: Clear to auscultation bilaterally. No wheezes, rhonchi, or rales. GI: Soft, non-distended, and non-tender. MS: No lower extremity edema. No deformity. Skin: Warm and dry. Neuro:  No focal deficits. Psych: Normal affect. Responds appropriately.  Labs    High Sensitivity Troponin:   Recent Labs  Lab 10/05/19 0108 10/05/19 0244 10/05/19 0541 10/05/19 0729  TROPONINIHS 100* 803* 3,888* 24,409*      Chemistry Recent Labs  Lab 10/05/19 0108 10/05/19 0143 10/05/19 0244 10/05/19 0254 10/07/19 0506 10/07/19 1147 10/08/19 0410  NA 139   < > 137   < > 139 137 138  K 5.6*   < > 4.6   < >  4.2 4.3 4.1  CL 101   < > 101   < > 98 97* 97*  CO2 27   < > 20*   < > 29 28 31   GLUCOSE 203*   < > 227*   < > 100* 121* 140*  BUN 23   < > 24*   < > 26* 29* 26*  CREATININE 1.36*   < > 1.49*   < > 1.27* 1.27* 1.17  CALCIUM 8.9   < > 8.6*   < > 8.7* 8.9 8.6*  PROT 6.2*  --  6.6  --   --   --   --   ALBUMIN 3.5  --  3.9  --   --   --   --   AST 71*  --  66*  --   --   --   --   ALT 99*  --  99*  --   --   --   --   ALKPHOS 73  --  81  --   --   --   --   BILITOT 2.0*  --  1.6*  --   --   --   --   GFRNONAA 55*   < > 49*   < > 59* 59* >60  GFRAA >60   < > 57*   < > >60 >60 >60  ANIONGAP 11   < > 16*   < > 12 12 10    < > = values in this interval not displayed.     Hematology Recent Labs  Lab 10/07/19 0506 10/08/19 0410 10/09/19 0630  WBC 10.8* 9.7 7.5    RBC 4.68 4.53 4.54  HGB 14.0 13.4 13.5  HCT 44.3 42.0 41.9  MCV 94.7 92.7 92.3  MCH 29.9 29.6 29.7  MCHC 31.6 31.9 32.2  RDW 13.8 13.4 13.3  PLT 158 154 151    BNP Recent Labs  Lab 10/05/19 0244  BNP 428.3*     DDimer No results for input(s): DDIMER in the last 168 hours.   Radiology    No results found.  Cardiac Studies   Echocardiogram 10/05/2019: Impressions: 1. Left ventricular ejection fraction, by estimation, is 30 to 35%. The  left ventricle has moderately decreased function. The left ventricle  demonstrates global hypokinesis. The left ventricular internal cavity size  was mildly dilated. Left ventricular  diastolic parameters are indeterminate.  2. Right ventricular systolic function is normal. The right ventricular  size is mildly enlarged. There is normal pulmonary artery systolic  pressure.  3. Left atrial size was severely dilated.  4. Right atrial size was mildly dilated.  5. The mitral valve is grossly normal. Trivial mitral valve  regurgitation. No evidence of mitral stenosis.  6. The aortic valve is grossly normal. Aortic valve regurgitation is not  visualized. No aortic stenosis is present.  7. The inferior vena cava is dilated in size with >50% respiratory  variability, suggesting right atrial pressure of 8 mmHg.   Comparison(s): No access to prior study for comparison.  _______________  Right/Left Cardiac Catheterization 10/06/2019:  Severe native vessel coronary disease with anatomically small left anterior descending and the anterior interventricular groove with much of the anterior wall territory supplied by a large branching diagonal.  Right dominant anatomy with the right coronary containing a proximal stent, mid to distal stent, and PDA stent.  Moderate distal in-stent restenosis involving the proximal segment, the nonstented mid segment contains diffuse 60% narrowing with 99% proximal margin  in-stent restenosis, and 99% in-stent  restenosis of the PDA stent.  The PDA does reach the left ventricular apex but was difficult to completely visualize due to respiratory disengagement of the diagnostic catheter.  Multiple catheters were used.  Circumflex contains a long stented segment from the mid circumflex into a large second obtuse marginal that is totally occluded.  A smaller second obtuse marginal is jailed by the stent and contains ostial 99% stenosis with slow flow.  Vessel diameter is small.  The larger stented branch fills faintly by left to left collaterals.  The LAD contains no significant obstruction before the origin of the large diagonal.  The diagonal contains perhaps 30 to 40% ostial narrowing.  The LAD beyond the origin of the diagonal contains 80% stenosis.  The LAD proper is small.  Moderate to severe pulmonary hypertension  Mean pulmonary capillary wedge pressure 34 mmHg  Left ventricular end-diastolic pressure 28 mmHg.  Consistent with acute on chronic combined systolic and diastolic heart failure.  Patient is unable to lie flat today for the procedure but was done with him sitting at 50 degrees and breathing high flow oxygen to maintain O2 sats greater than 95%  Recommendations:  First order of business is management of volume overload with aggressive diuresis and institution of guideline directed therapy for heart failure.  Whether or not revascularization should be attempted should be debated by the interventional team.  The right coronary is a target but each of 3 stents has significant in-stent restenosis, therefore long-term patency would be questionable.  The circumflex is totally occluded with poor collaterals.  The LAD is anatomically small but without high-grade obstruction.  This anatomy does not seem to be very conducive to surgical revascularization.  Concerned about prognosis in this patient given the anatomic substrate, diffuse in-stent restenosis throughout the right coronary and total  occlusion and long stents placed in the circumflex.  Resume IV heparin.  Early next week, need to determine if PCI is a reasonable revascularization approach after heart failure is controlled.  If PCI is performed, it should be from the femoral approach due to difficulty with catheter engagement of the right coronary.  Patient Profile     64 y.o. male CAD s/p prior DES x3 to the RCA and and DES x2 to LCX, hypertension, persistent atrial fibrillation on Eliquis, type 2 diabetes mellitus, and obesity who presented with acute shortness of breath and mild substernal chest pain and was found to be in acute heart failure with atrial fibrillation with RVR.  Assessment & Plan    NSTEMI  - High-sensitivity troponin peaked 24, 409.  - Patient underwent right/left heart catheterization on 10/06/2019 which showed severe native vessel CAD with occluded OM and multiple lesions in previously stented RCA. LVEDP and mean pulmonary capillary wedge pressure elevated.  She was unable to lie flat during the procedure so no intervention was performed and patient was started on aggressive diuresis for management of volume overload.  - She is scheduled to go back for left heart catheterization today. The patient understands that risks include but are not limited to stroke (1 in 1000), death (1 in 11), kidney failure [usually temporary] (1 in 500), bleeding (1 in 200), allergic reaction [possibly serious] (1 in 200), and agrees to proceed.  - Discussed with Dr. Tamala Julian. Will go ahead and load with Plavix 300mg  now. Continue aspirin, beta-blocker, and high-intensity statin.   Atrial Fibrillation with RVR  - Patient underwent emergent DCCV x2 in the ED with restoration  of sinus rhythm. Unfortunately, she he went back into atrial fibrillation following cardiac catheterization Started on Amiodarone on 5/15.  - Remains in atrial fibrillation with controlled/slow ventricular rate. A couple of short pauses note, longest one  being 2.34 seconds. Also had one 8 beat run of non-sustained VT. Completely asymptomatic with this. - Potassium 3.6 today. Will give K-Dur 40 mEq now. - Will check Magnesium.  - Continue low dose Coreg 3.125mg  twice dialy. - Continue IV Amiodarone for now. Can likely transition to PO after cath. - Currently on IV Heparin. Can likely transition to Eliquis after cath.  New Onset Acute Systolic CHF  - Chest x-ray showed bilateral pulmonary edema.  - BNP elevated at 428.  - Echo showed LVEF of 30-35% with global hypokinesis. RV mildly enlarged but systolic function and PASP normal.  - Right heart cath on LVEDP showed elevated LVEDP and mean pulmonary capillary wedge pressure of 28 mmHg and 34 mmHg, respectively.  - Patient started on IV Lasix. Documented urinary output of 3.9 L this admission and net negative 8 L since admission. Weight down almost 20 lbs since admission. Creatinine slightly elevated today but stable. - Medical therapy initially limited by hypotension but BP has improved some. Continue Coreg 3/125mg  twice daily. Consider adding ARB or Entresto following cath.  - Continue to monitor daily weights, strict I/O's, and renal function.   Diabetes Mellitus  - Continue sliding scale insulin.   For questions or updates, please contact [facility] Please consult [email or web address] for contact info under        Signed, Corrin Parker, PA-C  10/09/2019, 7:32 AM

## 2019-10-10 DIAGNOSIS — I2119 ST elevation (STEMI) myocardial infarction involving other coronary artery of inferior wall: Secondary | ICD-10-CM

## 2019-10-10 DIAGNOSIS — J810 Acute pulmonary edema: Secondary | ICD-10-CM

## 2019-10-10 LAB — GLUCOSE, CAPILLARY
Glucose-Capillary: 135 mg/dL — ABNORMAL HIGH (ref 70–99)
Glucose-Capillary: 133 mg/dL — ABNORMAL HIGH (ref 70–99)
Glucose-Capillary: 194 mg/dL — ABNORMAL HIGH (ref 70–99)
Glucose-Capillary: 180 mg/dL — ABNORMAL HIGH (ref 70–99)

## 2019-10-10 LAB — BASIC METABOLIC PANEL
Anion gap: 11 (ref 5–15)
BUN: 19 mg/dL (ref 8–23)
CO2: 34 mmol/L — ABNORMAL HIGH (ref 22–32)
Calcium: 8.9 mg/dL (ref 8.9–10.3)
Chloride: 93 mmol/L — ABNORMAL LOW (ref 98–111)
Creatinine, Ser: 1.42 mg/dL — ABNORMAL HIGH (ref 0.61–1.24)
GFR calc Af Amer: >60 mL/min (ref >60)
GFR calc non Af Amer: 52 mL/min — ABNORMAL LOW (ref >60)
Glucose, Bld: 135 mg/dL — ABNORMAL HIGH (ref 70–99)
Potassium: 4.5 mmol/L (ref 3.5–5.1)
Sodium: 138 mmol/L (ref 135–145)

## 2019-10-10 LAB — CBC
HCT: 42 % (ref 39.0–52.0)
Hemoglobin: 13.7 g/dL (ref 13.0–17.0)
MCH: 30.1 pg (ref 26.0–34.0)
MCHC: 32.6 g/dL (ref 30.0–36.0)
MCV: 92.3 fL (ref 80.0–100.0)
Platelets: 162 10*3/uL (ref 150–400)
RBC: 4.55 MIL/uL (ref 4.22–5.81)
RDW: 13.6 % (ref 11.5–15.5)
WBC: 8.6 10*3/uL (ref 4.0–10.5)
nRBC: 0 % (ref 0.0–0.2)

## 2019-10-10 LAB — MAGNESIUM: Magnesium: 2.3 mg/dL (ref 1.7–2.4)

## 2019-10-10 MED ORDER — AMIODARONE HCL 200 MG PO TABS
200.0000 mg | ORAL_TABLET | Freq: Two times a day (BID) | ORAL | Status: DC
  Administered 2019-10-10 – 2019-10-11 (×3): 200 mg via ORAL
  Filled 2019-10-10 (×4): qty 1

## 2019-10-10 MED ORDER — METOPROLOL SUCCINATE ER 50 MG PO TB24
50.0000 mg | ORAL_TABLET | Freq: Every day | ORAL | Status: DC
  Administered 2019-10-10 – 2019-10-11 (×2): 50 mg via ORAL
  Filled 2019-10-10 (×2): qty 1

## 2019-10-10 MED ORDER — LOSARTAN POTASSIUM 25 MG PO TABS
25.0000 mg | ORAL_TABLET | Freq: Every day | ORAL | Status: DC
  Administered 2019-10-10 – 2019-10-11 (×2): 25 mg via ORAL
  Filled 2019-10-10 (×2): qty 1

## 2019-10-10 MED ORDER — APIXABAN 5 MG PO TABS
5.0000 mg | ORAL_TABLET | Freq: Two times a day (BID) | ORAL | Status: DC
  Administered 2019-10-10 – 2019-10-11 (×3): 5 mg via ORAL
  Filled 2019-10-10 (×3): qty 1

## 2019-10-10 NOTE — Progress Notes (Signed)
Heart Failure Stewardship Pharmacist Progress Note   PCP: System, Pcp Not In PCP-Cardiologist: Olga Millers, MD    HPI:  64 yo M with PMH significant for CAD (s/p PCI of RCA and LCx), diabetes mellitus, and atrial fibrillation admitted on 5/13 for afib with RVR, acute pulmonary edema, and chest pain.   He was then cardioverted x 2 in the ED to sinus rhythm with improved ventricular rate.  ECHO on 10/05/19 with newly reduced LV dysfunction - LVEF 30-35%.  He has been receiving IV diuretics for volume overload during admission. Weight down 22 lbs since admission  Right and left heart catheterization completed 10/06/19. Found to have severe native vessel disease with moderate distal in-stent restenosis in RCA. LCx occluded with poor collaterals. No interventions/PCI completed pending further discussion with interventional +/- surgical team. Moderate pulmonary hypertension: RA 15, PA 44, PCWP 34, LVEDP 27, CO/CI 4.32/1.88.  S/p LHC on 5/17 - PCI with DES x 2 to mid and dist RCA and POBA of PDA restenosis  Current HF Medications: Carvedilol 3.125 mg BID Losartan 25 mg daily Invokana 100 mg (substitiution for [place])  Prior to admission HF Medications: Metoprolol succinate 50 mg daily Lisinopril 40 mg daily Farxiga 5 mg daily  Pertinent Lab Values: . Serum creatinine 1.42, BUN 19, Potassium 4.5, Sodium 138, BNP 428.3   Vital Signs: . Weight: 241 lbs (estimated dry weight: unkown) . Blood pressure: 120/70s  . Heart rate: 60-70s   Medication Assistance / Insurance Benefits Check: Does the patient have prescription insurance?   yes Type of insurance plan: commercial - [employer]  Does the patient qualify for medication assistance through manufacturers or grants?   yes  Eligible grants and/or patient assistance programs: pending  Medication assistance applications in progress: None  Medication assistance applications approved: None  Approved medication assistance renewals  will be completed by: Dr. Ludwig Clarks office  Outpatient Pharmacy:  [facility] Is the patient willing to use [facility] TOC pharmacy at discharge? Yes - pharmacy has been updated Is the patient willing to transition their outpatient pharmacy to utilize a [facility] outpatient pharmacy?   TBD    Assessment: 1. Acute systolic CHF (EF 32-12%), ischemic in etiology. L/RHC completed 5/14 with severe native vessel disease, in-stent restenosis, and moderate pulmonary hypertension. PCI x 2 to RCA and POBA to PDA restenosis. NYHA class II symptoms. Euvolemic on MD exam - No diuretics indicated at this time - Continue carvedilol 3.125 mg BID - Continue losartan 25 mg daily. Consider starting Entresto 24/26 mg BID pending BP tolerability - last dose of lisinopril was 10/04/19. - Consider adding spironolactone prior to discharge - Continue Invokana (substitution for [place]).    Plan: 1) Medication changes recommended at this time: - None - losartan added today. May be able to transition to [facility] soon  2) Patient assistance application(s): - None - Entresto would be $40 for 30 days or $100 for 90 days through [employer]. Can provide 30-day free card and copay card to bring copay down to $10 per month.  3)  Education  - To be completed prior to discharge  Danae Orleans, PharmD, BCPS Heart Failure Stewardship Pharmacist Phone [phone] 10/10/2019       9:17 AM

## 2019-10-10 NOTE — Progress Notes (Signed)
CARDIAC REHAB PHASE I   PRE:  Rate/Rhythm: 95 afib  BP:  Supine:   Sitting: 123/66  Standing:    SaO2: 96%RA  MODE:  Ambulation: 400 ft   POST:  Rate/Rhythm: 114-121 afib  BP:  Supine:   Sitting: 160/97  Standing:    SaO2: 96%RA 0810-0915 Pt walked 400 ft on RA with steady gait and no SOB or CP. Pt tolerated well. MI and CHF education completed with pt who voiced understanding. Stressed importance of plavix with stent. Discussed MI restrictions, NTG use, walking for ex, gave diabetic and heart healthy diets. Due to low EF, gave CHF booklet and low sodium diet. Encouraged pt to weigh daily, watch sodium, and reviewed signs/symptoms of when to call MD with CHF. Pt plans to go to [place] for a few weeks and then return to work. 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 Lives in [place] in [place] when working here. Referred to [place] [facility]. Pt is interested in participating in Virtual Cardiac and Pulmonary Rehab. Pt advised that Virtual Cardiac and Pulmonary Rehab is provided at no cost to the patient.  Checklist:  1. Pt has smart device  ie smartphone and/or ipad for downloading an app  Yes 2. Reliable internet/wifi service    Yes 3. Understands how to use their smartphone and navigate within an app.  Yes   Pt verbalized understanding and is in agreement.    Luetta Nutting, RN BSN  10/10/2019 9:09 AM

## 2019-10-10 NOTE — Progress Notes (Addendum)
The patient has been seen in conjunction with Marjie Skiff, PA-C. All aspects of care have been considered and discussed. The patient has been personally interviewed, examined, and all clinical data has been reviewed.   Has not used oxygen now in 36 hours.  Will encourage to ambulate in the hall.  Earlier today heart rates were in the 110 bpm range with activity.  Plan to switch amiodarone to oral dosing, increase metoprolol dose for better rate control, plan elective electrical cardioversion later this spring after loading with amnio, resume anticoagulation therapy, and prepare for possible discharge tomorrow.  He wants to drive to [place] in 1 week.  This will probably be okay assuming no new problems arise.  Carvedilol discontinued and Toprol-XL.  At discharge she should resume dapagliflozin.  Need to determine if the combination of Toprol-XL 50 and amiodarone is too strong/will cause bradycardia?  Amiodarone is being switched to oral dosing today.  Plan discharge tomorrow assuming no side effects/complications from the significant medication changes made today.   Progress Note  Patient Name: Carl Little Date of Encounter: 10/10/2019  Primary Cardiologist: Olga Millers, MD   Subjective   No acute overnight events. Patient doing well this morning. No chest pain, shortness of breath, palpitations, lightheadedness/dizziness. He feels ok to go home.  Inpatient Medications    Scheduled Meds:  aspirin EC  81 mg Oral Daily   atorvastatin  80 mg Oral Daily   canagliflozin  100 mg Oral QAC breakfast   carvedilol  3.125 mg Oral BID WC   chlorhexidine  15 mL Mouth Rinse BID   clopidogrel  75 mg Oral Q breakfast   furosemide  40 mg Intravenous BID   hydrocortisone cream   Topical BID   insulin aspart  0-15 Units Subcutaneous TID WC   insulin aspart  0-5 Units Subcutaneous QHS   mouth rinse  15 mL Mouth Rinse q12n4p   sodium chloride flush  3 mL Intravenous  Q12H   sodium chloride flush  3 mL Intravenous Q12H   sodium chloride flush  3 mL Intravenous Q12H   tamsulosin  0.4 mg Oral Daily   Continuous Infusions:  sodium chloride     sodium chloride     amiodarone 30 mg/hr (10/10/19 0630)   PRN Meds: sodium chloride, sodium chloride, acetaminophen, ondansetron (ZOFRAN) IV, sodium chloride flush, sodium chloride flush, zolpidem   Vital Signs    Vitals:   10/09/19 1700 10/09/19 1958 10/10/19 0016 10/10/19 0605  BP:  128/66 108/70 119/83  Pulse: 80 73 64 70  Resp:  16 17 16   Temp:  98.3 F (36.8 C) 97.6 F (36.4 C) 97.6 F (36.4 C)  TempSrc:  Oral Oral Oral  SpO2: 96% 98% 94% 97%  Weight:    109.7 kg  Height:        Intake/Output Summary (Last 24 hours) at 10/10/2019 0731 Last data filed at 10/10/2019 0630 Gross per 24 hour  Intake 862.02 ml  Output 3650 ml  Net -2787.98 ml   Last 3 Weights 10/10/2019 10/09/2019 10/08/2019  Weight (lbs) 241 lb 14.4 oz 245 lb 3.2 oz 250 lb 7.1 oz  Weight (kg) 109.725 kg 111.222 kg 113.6 kg      Telemetry    Atrial fibrillation with rates ranging from 60's to 120's at rest. - Personally Reviewed  ECG    Atrial fibrillation, rate 63 bpm, with non-specific ST/T changes. Left axis deviation. QTc 456 ms. - Personally Reviewed  Physical Exam  GEN: No acute distress.   Neck: Supple. No JVD. Cardiac: Irregularly irregular rhythm with mild tachycardia. No murmurs, rubs, or gallops. Right and left radial cath sites soft with no signs of hematoma. Respiratory: Clear to auscultation bilaterally. No wheezes, rhonchi, or rales. GI: Soft, non-distended, and non-tender. MS: No lower extremity edema. No deformity. Skin: Warm and dry.  Neuro:  No focal deficits. Psych: Normal affect.   Labs    High Sensitivity Troponin:   Recent Labs  Lab 10/05/19 0108 10/05/19 0244 10/05/19 0541 10/05/19 0729  TROPONINIHS 100* 803* 3,888* 24,409*      Chemistry Recent Labs  Lab 10/05/19 0108  10/05/19 0143 10/05/19 0244 10/05/19 0254 10/08/19 0410 10/09/19 0630 10/10/19 0503  NA 139   < > 137   < > 138 137 138  K 5.6*   < > 4.6   < > 4.1 3.6 4.5  CL 101   < > 101   < > 97* 93* 93*  CO2 27   < > 20*   < > 31 30 34*  GLUCOSE 203*   < > 227*   < > 140* 175* 135*  BUN 23   < > 24*   < > 26* 21 19  CREATININE 1.36*   < > 1.49*   < > 1.17 1.29* 1.42*  CALCIUM 8.9   < > 8.6*   < > 8.6* 8.5* 8.9  PROT 6.2*  --  6.6  --   --   --   --   ALBUMIN 3.5  --  3.9  --   --   --   --   AST 71*  --  66*  --   --   --   --   ALT 99*  --  99*  --   --   --   --   ALKPHOS 73  --  81  --   --   --   --   BILITOT 2.0*  --  1.6*  --   --   --   --   GFRNONAA 55*   < > 49*   < > >60 58* 52*  GFRAA >60   < > 57*   < > >60 >60 >60  ANIONGAP 11   < > 16*   < > 10 14 11    < > = values in this interval not displayed.     Hematology Recent Labs  Lab 10/08/19 0410 10/09/19 0630 10/10/19 0503  WBC 9.7 7.5 8.6  RBC 4.53 4.54 4.55  HGB 13.4 13.5 13.7  HCT 42.0 41.9 42.0  MCV 92.7 92.3 92.3  MCH 29.6 29.7 30.1  MCHC 31.9 32.2 32.6  RDW 13.4 13.3 13.6  PLT 154 151 162    BNP Recent Labs  Lab 10/05/19 0244  BNP 428.3*     DDimer No results for input(s): DDIMER in the last 168 hours.   Radiology    CARDIAC CATHETERIZATION  Result Date: 10/09/2019  Mid LAD lesion is 65% stenosed.  1st Diag lesion is 40% stenosed.  2nd Mrg lesion is 100% stenosed.  Mid Cx to Dist Cx lesion is 99% stenosed.  RPDA lesion is 80% stenosed.  Mid RCA lesion is 95% stenosed.  Dist RCA lesion is 50% stenosed.  Prox RCA to Mid RCA lesion is 65% stenosed.  Ost RCA lesion is 50% stenosed.  Post intervention, there is a 10% residual stenosis.  Balloon angioplasty was performed using a BALLOON EMERGE MR  2.5X12.  A drug-eluting stent was successfully placed using a STENT RESOLUTE ONYX [id number].  Post intervention, there is a 0% residual stenosis.  A drug-eluting stent was successfully placed using a STENT  RESOLUTE [id number].  Post intervention, there is a 0% residual stenosis.  Post intervention, there is a 0% residual stenosis.  1. Successful PCI of the RCA including POBA of the PDA restenosis, stenting of the distal RCA with 2.75 x 22 mm Resolute Onyx, stenting of the mid RCA with 3.0 x 38 mm Resolute Onyx stent using IVUS guidance. Plan: DAPT - ASA 81 mg daily for one month, Plavix 75 mg daily for at least 12 months. May initiate anticoagulation with DOAC tomorrow.    Cardiac Studies   Echocardiogram 10/05/2019: Impressions: 1. Left ventricular ejection fraction, by estimation, is 30 to 35%. The  left ventricle has moderately decreased function. The left ventricle  demonstrates global hypokinesis. The left ventricular internal cavity size  was mildly dilated. Left ventricular  diastolic parameters are indeterminate.  2. Right ventricular systolic function is normal. The right ventricular  size is mildly enlarged. There is normal pulmonary artery systolic  pressure.  3. Left atrial size was severely dilated.  4. Right atrial size was mildly dilated.  5. The mitral valve is grossly normal. Trivial mitral valve  regurgitation. No evidence of mitral stenosis.  6. The aortic valve is grossly normal. Aortic valve regurgitation is not  visualized. No aortic stenosis is present.  7. The inferior vena cava is dilated in size with >50% respiratory  variability, suggesting right atrial pressure of 8 mmHg.   Comparison(s): No access to prior study for comparison.  _______________  Right/Left Cardiac Catheterization 10/06/2019:  Severe native vessel coronary disease with anatomically small left anterior descending and the anterior interventricular groove with much of the anterior wall territory supplied by a large branching diagonal.  Right dominant anatomy with the right coronary containing a proximal stent, mid to distal stent, and PDA stent. Moderate distal in-stent restenosis  involving the proximal segment, the nonstented mid segment contains diffuse 60% narrowing with 99% proximal margin in-stent restenosis, and 99% in-stent restenosis of the PDA stent. The PDA does reach the left ventricular apex but was difficult to completely visualize due to respiratory disengagement of the diagnostic catheter. Multiple catheters were used.  Circumflex contains a long stented segment from the mid circumflex into a large second obtuse marginal that is totally occluded. A smaller second obtuse marginal is jailed by the stent and contains ostial 99% stenosis with slow flow. Vessel diameter is small. The larger stented branch fills faintly by left to left collaterals.  The LAD contains no significant obstruction before the origin of the large diagonal. The diagonal contains perhaps 30 to 40% ostial narrowing. The LAD beyond the origin of the diagonal contains 80% stenosis. The LAD proper is small.  Moderate to severe pulmonary hypertension  Mean pulmonary capillary wedge pressure 34 mmHg  Left ventricular end-diastolic pressure 28 mmHg. Consistent with acute on chronic combined systolic and diastolic heart failure. Patient is unable to lie flat today for the procedure but was done with him sitting at 50 degrees and breathing high flow oxygen to maintain O2 sats greater than 95%  Recommendations:  First order of business is management of volume overload with aggressive diuresis and institution of guideline directed therapy for heart failure.  Whether or not revascularization should be attempted should be debated by the interventional team. The right coronary is a target but each of  3 stents has significant in-stent restenosis, therefore long-term patency would be questionable. The circumflex is totally occluded with poor collaterals. The LAD is anatomically small but without high-grade obstruction. This anatomy does not seem to be very conducive to surgical  revascularization.  Concerned about prognosis in this patient given the anatomic substrate, diffuse in-stent restenosis throughout the right coronary and total occlusion and long stents placed in the circumflex.  Resume IV heparin.  Early next week, need to determine if PCI is a reasonable revascularization approach after heart failure is controlled. If PCI is performed, it should be from the femoral approach due to difficulty with catheter engagement of the right coronary.  _______________  Coronary Stent Intervention 10/09/2019:  Mid LAD lesion is 65% stenosed.  1st Diag lesion is 40% stenosed.  2nd Mrg lesion is 100% stenosed.  Mid Cx to Dist Cx lesion is 99% stenosed.  RPDA lesion is 80% stenosed.  Mid RCA lesion is 95% stenosed.  Dist RCA lesion is 50% stenosed.  Prox RCA to Mid RCA lesion is 65% stenosed.  Ost RCA lesion is 50% stenosed.  Post intervention, there is a 10% residual stenosis.  Balloon angioplasty was performed using a BALLOON EMERGE MR 2.5X12.  A drug-eluting stent was successfully placed using a STENT RESOLUTE ONYX [id number].  Post intervention, there is a 0% residual stenosis.  A drug-eluting stent was successfully placed using a STENT RESOLUTE [id number].  Post intervention, there is a 0% residual stenosis.  Post intervention, there is a 0% residual stenosis.   1. Successful PCI of the RCA including POBA of the PDA restenosis, stenting of the distal RCA with 2.75 x 22 mm Resolute Onyx, stenting of the mid RCA with 3.0 x 38 mm Resolute Onyx stent using IVUS guidance.  Plan: DAPT - ASA 81 mg daily for one month, Plavix 75 mg daily for at least 12 months. May initiate anticoagulation with DOAC tomorrow.   Patient Profile     64 y.o. male with a history of CAD s/p prior DES x3 to the RCA and and DES x2 to LCX, hypertension, persistent atrial fibrillation on Eliquis, type 2 diabetes mellitus, and obesity who presented with acute shortness of breath  and mild substernal chest pain and was found to be in acute heart failure with atrial fibrillation with RVR.  Assessment & Plan    NSTEMI  - High-sensitivity troponin peaked 24, 409.  - Patient underwent right/left heart catheterization on 5/14 which showed severe native vessel CAD with occluded OM and multiple lesions in previously stented RCA. LVEDP and mean pulmonary capillary wedge pressure elevated.  She was unable to lie flat during the procedure so no intervention was performed and patient was started on aggressive diuresis for management of volume overload. He went back to cath lab on 5/17 and underwent successful PCI with DES x2 to the mid and distal RCA and POBA of the PDA restenosis.  - No angina. - Continue triple therapy with Aspirin 81mg  daily, Plavix 75mg  daily, and Eliquis 5mg  twice daily for 1 month at which time Aspirin can then be stopped. - Continue beta-blocker and high-intensity statin.  Atrial Fibrillation with RVR  - Patient underwent emergent DCCV x2 in the ED with restoration of sinus rhythm. Unfortunately, she he went back into atrial fibrillation following cardiac catheterization Started on Amiodarone on 5/15.  - Remains in atrial fibrillation. Rates not as well controlled today. Rates in the 80's to 120's (with occasional spikes in 130's to 140's) while I was  in the room.  - Currently on low dose Coreg 3.125mg  twice daily but was on Toprol-XL 50mg  daily at home. I think Toprol may help control rates better. He has already received morning dose of Coreg. Will give Toprol-XL 25mg  today and then can start 50mg  daily tomorrow. - Currently on IV Amiodarone. Will transition to PO 200mg  twice daily x2 weeks and then transition to 200mg  daily.  - Currently on IV Heparin. Will start Eliquis 5mg  twice daily today.  New Onset Acute Systolic CHF  - Chest x-ray showed bilateral pulmonary edema.  - BNP elevated at 428.  - Echo showed LVEF of 30-35% with global hypokinesis. RV  mildly enlarged but systolic function and PASP normal.  - Right heart cath on LVEDP showed elevated LVEDP and mean pulmonary capillary wedge pressure of 28 mmHg and 34 mmHg, respectively.  - Currently on IV Lasix 40mg  twice daily. Documented urinary output of 3.65 L over the past 24 hours and net negative 10.8 L since admission. Weight down 22 lbs since admission. Creatinine slightly elevated today.  - Patient has already received morning dose of IV Lasix. Will transition to PO Lasix 40mg  daily tomorrow. - Medical therapy initially has been limited some hypotension but BP has improved some. Will start Losartan 25mg  daily. If BP and renal function tolerate this, can consider Entresto as outpatient. - Continue to monitor daily weights, strict I/O's, and renal function.   Diabetes Mellitus  - Continue sliding scale insulin here. - Can restart home medications as outpatient.   Disposition: Patient can likely go home today if her tolerates these above medications changes well. MD to follow with additional recommendations.  For questions or updates, please contact [place] Please consult [email or web address] for contact info under        Signed, Darreld Mclean, PA-C  10/10/2019, 7:31 AM

## 2019-10-11 DIAGNOSIS — E669 Obesity, unspecified: Secondary | ICD-10-CM

## 2019-10-11 DIAGNOSIS — E1169 Type 2 diabetes mellitus with other specified complication: Secondary | ICD-10-CM

## 2019-10-11 DIAGNOSIS — N183 Chronic kidney disease, stage 3 unspecified: Secondary | ICD-10-CM

## 2019-10-11 DIAGNOSIS — E785 Hyperlipidemia, unspecified: Secondary | ICD-10-CM

## 2019-10-11 DIAGNOSIS — I10 Essential (primary) hypertension: Secondary | ICD-10-CM

## 2019-10-11 LAB — BASIC METABOLIC PANEL
Anion gap: 14 (ref 5–15)
BUN: 24 mg/dL — ABNORMAL HIGH (ref 8–23)
CO2: 30 mmol/L (ref 22–32)
Calcium: 8.8 mg/dL — ABNORMAL LOW (ref 8.9–10.3)
Chloride: 92 mmol/L — ABNORMAL LOW (ref 98–111)
Creatinine, Ser: 1.26 mg/dL — ABNORMAL HIGH (ref 0.61–1.24)
GFR calc Af Amer: >60 mL/min (ref >60)
GFR calc non Af Amer: 60 mL/min — ABNORMAL LOW (ref >60)
Glucose, Bld: 124 mg/dL — ABNORMAL HIGH (ref 70–99)
Potassium: 3.5 mmol/L (ref 3.5–5.1)
Sodium: 136 mmol/L (ref 135–145)

## 2019-10-11 LAB — GLUCOSE, CAPILLARY
Glucose-Capillary: 132 mg/dL — ABNORMAL HIGH (ref 70–99)
Glucose-Capillary: 155 mg/dL — ABNORMAL HIGH (ref 70–99)

## 2019-10-11 MED ORDER — POTASSIUM CHLORIDE CRYS ER 20 MEQ PO TBCR
20.0000 meq | EXTENDED_RELEASE_TABLET | Freq: Once | ORAL | Status: AC
  Administered 2019-10-11: 20 meq via ORAL
  Filled 2019-10-11: qty 1

## 2019-10-11 MED ORDER — FUROSEMIDE 40 MG PO TABS
40.0000 mg | ORAL_TABLET | Freq: Every day | ORAL | Status: DC
  Administered 2019-10-11: 40 mg via ORAL
  Filled 2019-10-11: qty 1

## 2019-10-11 MED ORDER — AMIODARONE HCL 200 MG PO TABS: ORAL_TABLET | ORAL | 2 refills | Status: AC

## 2019-10-11 MED ORDER — LOSARTAN POTASSIUM 25 MG PO TABS: 25.0000 mg | ORAL_TABLET | Freq: Every day | ORAL | 2 refills | Status: AC

## 2019-10-11 MED ORDER — FUROSEMIDE 40 MG PO TABS: 40.0000 mg | ORAL_TABLET | Freq: Every day | ORAL | 2 refills | Status: AC

## 2019-10-11 MED ORDER — AMIODARONE HCL 200 MG PO TABS: 200.0000 mg | ORAL_TABLET | Freq: Every day | ORAL | Status: DC

## 2019-10-11 MED ORDER — CLOPIDOGREL BISULFATE 75 MG PO TABS: 75.0000 mg | ORAL_TABLET | Freq: Every day | ORAL | 11 refills | Status: AC

## 2019-10-11 MED FILL — AMIODARONE HCL 200 MG TAB: 200 | 30 days supply | Qty: 45 | Fill #0

## 2019-10-11 MED FILL — CLOPIDOGREL 75 MG TABLET: 75 | 30 days supply | Qty: 30 | Fill #0

## 2019-10-11 MED FILL — FUROSEMIDE 40 MG TABLET: 40 | 30 days supply | Qty: 30 | Fill #0

## 2019-10-11 MED FILL — LOSARTAN POTASSIUM 25 MG TA: 25 | 30 days supply | Qty: 30 | Fill #0

## 2019-10-11 NOTE — Progress Notes (Addendum)
Heart Failure Stewardship Pharmacist Progress Note   PCP: System, Pcp Not In PCP-Cardiologist: Kirk Ruths, MD    HPI:  64 yo M with PMH significant for CAD (s/p PCI of RCA and LCx), diabetes mellitus, and atrial fibrillation admitted on 5/13 for afib with RVR, acute pulmonary edema, and chest pain.   He was then cardioverted x 2 in the ED to sinus rhythm with improved ventricular rate.  ECHO on 10/05/19 with newly reduced LV dysfunction - LVEF 30-35%.  He has been receiving IV diuretics for volume overload during admission. Weight down 21 lbs since admission  Right and left heart catheterization completed 10/06/19. Found to have severe native vessel disease with moderate distal in-stent restenosis in RCA. LCx occluded with poor collaterals. No interventions/PCI completed pending further discussion with interventional +/- surgical team. Moderate pulmonary hypertension: RA 15, PA 44, PCWP 34, LVEDP 27, CO/CI 4.32/1.88.  S/p LHC on 5/17 - PCI with DES x 2 to mid and dist RCA and POBA of PDA restenosis  Current HF Medications: Metoprolol succinate 50 mg daily Losartan 25 mg daily Invokana 100 mg (substitiution for [place])  Prior to admission HF Medications: Metoprolol succinate 50 mg daily Lisinopril 40 mg daily Farxiga 5 mg daily  Pertinent Lab Values: . Serum creatinine 1.26, BUN 24, Potassium 3.5, Sodium 136, BNP 428.3   Vital Signs: . Weight: 242 lbs (estimated dry weight: unkown) . Blood pressure: 110/70s  . Heart rate: 60-70s   Medication Assistance / Insurance Benefits Check: Does the patient have prescription insurance?   yes Type of insurance plan: commercial - [place]  Does the patient qualify for medication assistance through manufacturers or grants?   yes  Eligible grants and/or patient assistance programs: pending HF regimen selection  Medication assistance applications in progress: None  Medication assistance applications approved: None  Approved  medication assistance renewals will be completed by: Dr. Jacalyn Lefevre office  Outpatient Pharmacy:  Prior to admission outpatient pharmacy: [facility] Is the patient willing to use [facility] at discharge? Yes - pharmacy has been updated Is the patient willing to transition their outpatient pharmacy to utilize a [facility] outpatient pharmacy?   No - patient travels frequently for work    Assessment: 1. Acute systolic CHF (EF 95-18%), ischemic in etiology. L/RHC completed 5/14 with severe native vessel disease, in-stent restenosis, and moderate pulmonary hypertension. PCI x 2 to RCA and POBA to PDA restenosis. NYHA class II symptoms. Euvolemic on MD exam - Continue furosemide 40 mg PO daily - Continue metoprolol XL 50 mg daily - Continue losartan 25 mg daily. Consider starting Entresto 24/26 mg BID pending BP tolerability - last dose of lisinopril was 10/04/19. - Consider adding spironolactone at hospital follow up appointment  - Continue Invokana (substitution for [place]). Will continue Farxiga at discharge   Plan: 1) Medication changes recommended at this time: - Switch losartan to Entresto 24/26 mg BID during hospital follow up clinic visit  2) Patient assistance application(s): - None - Entresto would be $40 for 30 days or $100 for 90 days through [employer]. Provided with 30-day free card and copay card to bring copay down to $10 per month.  3)  Education  - Patient has been educated on current HF medications (metoprolol, losartan, Farxiga) and potential additions to HF medication regimen (Entresto, spironolactone) - Patient verbalizes understanding that over the next few months, these medication doses may change and more medications may be added to optimize HF regimen - Patient has  been educated on basic disease state pathophysiology and goals of therapy - Time spent (30 min)     Danae Orleans, PharmD, BCPS Heart Failure Stewardship Pharmacist Phone [phone] 10/11/2019       10:16 AM

## 2019-10-11 NOTE — Discharge Summary (Addendum)
The patient has been seen in conjunction with Marjie Skiff, PA-C. All aspects of care have been considered and discussed. The patient has been personally interviewed, examined, and all clinical data has been reviewed.   He feels much better today.  Heart rate is under good control on and uptitrated beta-blocker regimen.  Creatinine is stable at 1.26 with potassium of 3.5.  He will be discharged on furosemide 40 mg/day and 20 mEq of potassium daily.  Heart failure therapy includes: Furosemide 40 mg daily, losartan 25 mg daily, metoprolol succinate 50 mg daily and Jardiance 10 mg/day  Atrial fibrillation management: Loading with amiodarone and will need cardioversion in 3 to 4 weeks.  Continue apixaban uninterrupted.  At follow-up visit need to consider adjusting amiodarone dose and or beta-blocker depending upon heart rate.  Hope to get 2 weeks of 200 twice daily therapy before decreasing amiodarone to 200 mg/day.  Post PCI: High intensity statin therapy, plan to drop aspirin 4 weeks post PCI.  Continue clopidogrel with apixaban.  The patient is adamant that he will drive to [place] in 1 week.  We have urged him to call us if dizziness, lightheadedness, dyspnea, swelling, or other complaints prior to taking the trip.  He is already arranged follow-ups with Dr. Matthias Hughs his cardiologist and also his primary care physician.  He will be seen in cardiology clinic here in [place] in late June.    Discharge Summary    Patient ID: Carl Little MRN: [id number]; DOB: 1956-01-28  Admit date: 10/05/2019 Discharge date: 10/11/2019  Primary Care Provider: System, Pcp Not In  Primary Cardiologist: Olga Millers, MD  Primary Electrophysiologist:  None   Discharge Diagnoses    Principal Problem:   Non-ST elevation (NSTEMI) myocardial infarction [facility]) Active Problems:   Atrial fibrillation with rapid ventricular response (HCC)   Acute systolic CHF (congestive heart failure) (HCC)   Type  2 diabetes mellitus with obesity (HCC)   CKD (chronic kidney disease), stage III   Hypertension   Dyslipidemia    Diagnostic Studies/Procedures    Echocardiogram 10/05/2019: Impressions: 1. Left ventricular ejection fraction, by estimation, is 30 to 35%. The  left ventricle has moderately decreased function. The left ventricle  demonstrates global hypokinesis. The left ventricular internal cavity size  was mildly dilated. Left ventricular  diastolic parameters are indeterminate.  2. Right ventricular systolic function is normal. The right ventricular  size is mildly enlarged. There is normal pulmonary artery systolic  pressure.  3. Left atrial size was severely dilated.  4. Right atrial size was mildly dilated.  5. The mitral valve is grossly normal. Trivial mitral valve  regurgitation. No evidence of mitral stenosis.  6. The aortic valve is grossly normal. Aortic valve regurgitation is not  visualized. No aortic stenosis is present.  7. The inferior vena cava is dilated in size with >50% respiratory  variability, suggesting right atrial pressure of 8 mmHg.   Comparison(s): No access to prior study for comparison.  _______________  Right/Left Cardiac Catheterization 10/06/2019:  Severe native vessel coronary disease with anatomically small left anterior descending and the anterior interventricular groove with much of the anterior wall territory supplied by a large branching diagonal.  Right dominant anatomy with the right coronary containing a proximal stent, mid to distal stent, and PDA stent. Moderate distal in-stent restenosis involving the proximal segment, the nonstented mid segment contains diffuse 60% narrowing with 99% proximal margin in-stent restenosis, and 99% in-stent restenosis of the PDA stent. The PDA does reach the  left ventricular apex but was difficult to completely visualize due to respiratory disengagement of the diagnostic catheter. Multiple catheters  were used.  Circumflex contains a long stented segment from the mid circumflex into a large second obtuse marginal that is totally occluded. A smaller second obtuse marginal is jailed by the stent and contains ostial 99% stenosis with slow flow. Vessel diameter is small. The larger stented branch fills faintly by left to left collaterals.  The LAD contains no significant obstruction before the origin of the large diagonal. The diagonal contains perhaps 30 to 40% ostial narrowing. The LAD beyond the origin of the diagonal contains 80% stenosis. The LAD proper is small.  Moderate to severe pulmonary hypertension  Mean pulmonary capillary wedge pressure 34 mmHg  Left ventricular end-diastolic pressure 28 mmHg. Consistent with acute on chronic combined systolic and diastolic heart failure. Patient is unable to lie flat today for the procedure but was done with him sitting at 50 degrees and breathing high flow oxygen to maintain O2 sats greater than 95%  Recommendations:  First order of business is management of volume overload with aggressive diuresis and institution of guideline directed therapy for heart failure.  Whether or not revascularization should be attempted should be debated by the interventional team. The right coronary is a target but each of 3 stents has significant in-stent restenosis, therefore long-term patency would be questionable. The circumflex is totally occluded with poor collaterals. The LAD is anatomically small but without high-grade obstruction. This anatomy does not seem to be very conducive to surgical revascularization.  Concerned about prognosis in this patient given the anatomic substrate, diffuse in-stent restenosis throughout the right coronary and total occlusion and long stents placed in the circumflex.  Resume IV heparin.  Early next week, need to determine if PCI is a reasonable revascularization approach after heart failure is controlled. If PCI  is performed, it should be from the femoral approach due to difficulty with catheter engagement of the right coronary.  _______________  Coronary Stent Intervention 10/09/2019:  Mid LAD lesion is 65% stenosed.  1st Diag lesion is 40% stenosed.  2nd Mrg lesion is 100% stenosed.  Mid Cx to Dist Cx lesion is 99% stenosed.  RPDA lesion is 80% stenosed.  Mid RCA lesion is 95% stenosed.  Dist RCA lesion is 50% stenosed.  Prox RCA to Mid RCA lesion is 65% stenosed.  Ost RCA lesion is 50% stenosed.  Post intervention, there is a 10% residual stenosis.  Balloon angioplasty was performed using a BALLOON EMERGE MR 2.5X12.  A drug-eluting stent was successfully placed using a STENT RESOLUTE ONYX [id number].  Post intervention, there is a 0% residual stenosis.  A drug-eluting stent was successfully placed using a STENT RESOLUTE [id number].  Post intervention, there is a 0% residual stenosis.  Post intervention, there is a 0% residual stenosis.  1. Successful PCI of the RCA including POBA of the PDA restenosis, stenting of the distal RCA with 2.75 x 22 mm Resolute Onyx, stenting of the mid RCA with 3.0 x 38 mm Resolute Onyx stent using IVUS guidance.  Plan: DAPT - ASA 81 mg daily for one month, Plavix 75 mg daily for at least 12 months. May initiate anticoagulation with DOAC tomorrow.   Diagnostic Dominance: Right  Intervention      History of Present Illness     Kanon Novosel is a 64 year old male with a history of CAD s/p prior DES x3 to the RCA and and DES x2 to LCX,  hypertension, persistent atrial fibrillation on Eliquis, type 2 diabetes mellitus, and obesity who was activated as a STEMI after developing acute shortness of breath and mild substernal chest pain at home and found to be in acute heart failure with atrial fibrillation with RVR on presentation to the ED.    En route with EMS, there were concerns for inferior dynamic ST elevations. On arrival to ED, he was  complaining of 2/10 chest pain with more significant shortness of breath. He was found to be in atrial fibrillation with RVR. Chest x-ray showed bilateral pulmonary edema. He endorsed complete adherence to his home Eliquis. He was sedated with assistance of ED attending and DCCV was attempted with persistent atrial fibrillation with RVR. A second DCCV was performed with conversion to normal sinus rhythm. He required brief bag mask ventilation following this but was able to recover to non-rebreather.    Repeat EKG after conversion to sinus rhythm with improved ventricular rate demonstrated resolving mild ST elevation in inferior leads without ST elevation.   He reported subacute worsening of shortness of breath over the last few day as well as increasing lower extremity edema but no significant chest pain. He laid down on night of presentation and felt markedly dyspneic. He denied known prior history of heart failure and does not take diuretics. He had not been using sympathomimetics. While he had been on anticoagulation for at least a year and had discussed his atrial fibrillation with his cardiologist, he could not confirm a diagnosis of atrial fibrillation.    Hospital Course     Consultants: None  NSTEMI  High-sensitivity troponin peaked 24, 409. Patient underwent right/left heart catheterization on 5/14 which showed severe native vessel CAD withoccluded OM and multiple lesions in previously stented RCA. She was noted to have LVEDP of 28 mmHg, mean pulmonary capillary wedge pressure of 34 mmHg, and moderate to severe pulmonary hypertension. She was unable to lie flat during the procedure, so no intervention was performed and patient was started on aggressive diuresis for management of volume overload. He returned to the cath lab on 5/17 and underwent stenting successful PCI with DES x2 to the mid and distal RCA and POBA of the PDA restenosis. He tolerated the procedure well. Radial cath site soft  with no signs of hematoma. Renal function stable. Able to ambulate with Cardiac Rehab without any problems.  - Will be on triple therapy with Aspirin  daily, Plavix  daily, and Eliquis  twice daily for 1 month at which time Aspirin can then be stopped. Plavix should be continued for at least 12 months. - Continue beta-blocker and high-intensity statin.  Atrial Fibrillation with RVR  Patient underwent emergent DCCV x2 in the ED with restoration of sinus rhythm. Unfortunately, she he went back into atrial fibrillation following cardiac catheterization Started on IV Amiodarone on 5/15 and switched to PO on 5/18. Rates well controlled.  - Continue Toprol-XL  daily. - Continue PO Amiodarone  twice daily x2 weeks and then switch to  daily. - Continue chronic anticoagulation with Eliquis  twice daily. - Can consider outpatient DCCV after 3-4 weeks of uninterrupted anticoagulation.  New Onset Acute Systolic CHF  Chest x-ray showed bilateral pulmonary edema. BNP elevated at 428. Echo showed LVEF of 30-35% with global hypokinesis. RV mildly enlarged but systolic function and PASP normal. Right heart cath on LVEDP showed elevated LVEDP and mean pulmonary capillary wedge pressure of 28 mmHg and 34 mmHg, respectively. Patient started on IV Lasix. Guideline medical therapy  initially limited some by hypotension and patient able to tolerate beta-blocker and ARB. Net negative 12.8 L. Diuresed 22 lbs during admission. Discharge weight 242 lb.  - Will discharge on Lasix  daily with K-Dur 20 mEq daily. - Continue home Toprol-XL  daily. - Continue Losartan  daily. If BP and renal function tolerates this, could transition to [facility] as outpatient. - Discussed importance of daily weights and sodium/fluid restrictions. - Will need repeat BMET in about 1 week.  Hypertension History of hypertension but BP has been soft for much of admission. - Continue home Toprol-XL   daily. - Home Amlodipine discontinued. - Home Lisinopril discontinued and Losartan started as above. - Suspect we will be able to up-titrate medications as outpatient.   Dyslipidemia Lipid panel this admission: Total Cholesterol 101, Triglycerides 49, HDL 33, LDL 58. At LDL goal of <70 given CAD. - Continue home Lipitor  daily.  Diabetes Mellitus  Hemoglobin A1c 7.6 this admission. Started on sliding scale insulin here. Will restart home medications on discharge.  CKD Stage III Creatinine 1.36 on admission and peaked at 1.49. Unclear baseline. Creatinine 1.26 on day of discharge. - Will need repeat BMET in about 1 week.  Patient seen and examined today by Dr. Katrinka Blazing and determined to be stable for discharge. Follow-up arranged. Patient is from [place], [place] but is working here in [place] for 2 years. He is insistent on going back to [place] later this week. He will schedule a follow-up with his primary Cardiologist there within the next 1-2 weeks and then follow-up with in about 1 month. Patient knows he needs to have BMET checked at follow-up visit in [place]. Medications as below.    Did the patient have an acute coronary syndrome (MI, NSTEMI, STEMI, etc) this admission?:  Yes                               AHA/ACC Clinical Performance & Quality Measures: 13. Aspirin prescribed? - Yes 14. ADP Receptor Inhibitor (Plavix/Clopidogrel, Brilinta/Ticagrelor or Effient/Prasugrel) prescribed (includes medically managed patients)? - Yes 15. Beta Blocker prescribed? - Yes 16. High Intensity Statin (Lipitor 40-80mg  or Crestor 20-40mg ) prescribed? - Yes 17. EF assessed during THIS hospitalization? - Yes 18. For EF <40%, was ACEI/ARB prescribed? - Yes 19. For EF <40%, Aldosterone Antagonist (Spironolactone or Eplerenone) prescribed? - No, BP soft at times. Can consider adding as outpatient if BP and renal function allow. 20. Cardiac Rehab Phase II ordered (Included Medically managed Patients)? - Yes    _____________  Discharge Vitals Blood pressure 117/77, pulse 63, temperature 98.1 F (36.7 C), temperature source Oral, resp. rate 18, height 5' 7.5" (1.715 m), weight 110 kg, SpO2 99 %.  Filed Weights   10/09/19 0552 10/10/19 0605 10/11/19 0314  Weight: 111.2 kg 109.7 kg 110 kg   General: 64 y.o. male resting comfortably in no acute distress. HEENT: Normocephalic and atraumatic.  Neck: Supple. No JVD. Heart: Irregularly irregular rhythm with normal rate. Distinct S1 and S2. No murmurs, gallops, or rubs. Radial pulses 2+ and equal bilaterally. Right and left radial cath sites soft with no signs of hematoma. Lungs: No increased work of breathing. Clear to ausculation bilaterally. No wheezes, rhonchi, or rales.  Abdomen: Soft, non-distended, and non-tender to palpation. Bowel sounds present. Extremities: No to trace lower extremity edema bilaterally.    Skin: Warm and dry. Neuro: Alert and oriented x3. No focal deficits. Psych: Normal affect. Responds appropriately.  Labs &  Radiologic Studies    CBC Recent Labs    10/09/19 0630 10/10/19 0503  WBC 7.5 8.6  HGB 13.5 13.7  HCT 41.9 42.0  MCV 92.3 92.3  PLT 151 162   Basic Metabolic Panel Recent Labs    34/19/62 0503 10/11/19 0425  NA 138 136  K 4.5 3.5  CL 93* 92*  CO2 34* 30  GLUCOSE 135* 124*  BUN 19 24*  CREATININE 1.42* 1.26*  CALCIUM 8.9 8.8*  MG 2.3  --    Liver Function Tests No results for input(s): AST, ALT, ALKPHOS, BILITOT, PROT, ALBUMIN in the last 72 hours. No results for input(s): LIPASE, AMYLASE in the last 72 hours. High Sensitivity Troponin:   Recent Labs  Lab 10/05/19 0108 10/05/19 0244 10/05/19 0541 10/05/19 0729  TROPONINIHS 100* 803* 3,888* 24,409*    BNP Invalid input(s): POCBNP D-Dimer No results for input(s): DDIMER in the last 72 hours. Hemoglobin A1C No results for input(s): HGBA1C in the last 72 hours. Fasting Lipid Panel No results for input(s): CHOL, HDL, LDLCALC, TRIG,  CHOLHDL, LDLDIRECT in the last 72 hours. Thyroid Function Tests No results for input(s): TSH, T4TOTAL, T3FREE, THYROIDAB in the last 72 hours.  Invalid input(s): FREET3 _____________  CARDIAC CATHETERIZATION  Result Date: 10/09/2019  Mid LAD lesion is 65% stenosed.  1st Diag lesion is 40% stenosed.  2nd Mrg lesion is 100% stenosed.  Mid Cx to Dist Cx lesion is 99% stenosed.  RPDA lesion is 80% stenosed.  Mid RCA lesion is 95% stenosed.  Dist RCA lesion is 50% stenosed.  Prox RCA to Mid RCA lesion is 65% stenosed.  Ost RCA lesion is 50% stenosed.  Post intervention, there is a 10% residual stenosis.  Balloon angioplasty was performed using a BALLOON EMERGE MR 2.5X12.  A drug-eluting stent was successfully placed using a STENT RESOLUTE ONYX [id number].  Post intervention, there is a 0% residual stenosis.  A drug-eluting stent was successfully placed using a STENT RESOLUTE [id number].  Post intervention, there is a 0% residual stenosis.  Post intervention, there is a 0% residual stenosis.  1. Successful PCI of the RCA including POBA of the PDA restenosis, stenting of the distal RCA with 2.75 x 22 mm Resolute Onyx, stenting of the mid RCA with 3.0 x 38 mm Resolute Onyx stent using IVUS guidance. Plan: DAPT - ASA 81 mg daily for one month, Plavix 75 mg daily for at least 12 months. May initiate anticoagulation with DOAC tomorrow.   CARDIAC CATHETERIZATION  Result Date: 10/09/2019  Severe native vessel coronary disease with anatomically small left anterior descending and the anterior interventricular groove with much of the anterior wall territory supplied by a large branching diagonal.  Right dominant anatomy with the right coronary containing a proximal stent, mid to distal stent, and PDA stent.  Moderate distal in-stent restenosis involving the proximal segment, the nonstented mid segment contains diffuse 60% narrowing with 99% proximal margin in-stent restenosis, and 99% in-stent restenosis  of the PDA stent.  The PDA does reach the left ventricular apex but was difficult to completely visualize due to respiratory disengagement of the diagnostic catheter.  Multiple catheters were used.  Circumflex contains a long stented segment from the mid circumflex into a large second obtuse marginal that is totally occluded.  A smaller second obtuse marginal is jailed by the stent and contains ostial 99% stenosis with slow flow.  Vessel diameter is small.  The larger stented branch fills faintly by left to left collaterals.  The  LAD contains no significant obstruction before the origin of the large diagonal.  The diagonal contains perhaps 30 to 40% ostial narrowing.  The LAD beyond the origin of the diagonal contains 80% stenosis.  The LAD proper is small.  Moderate to severe pulmonary hypertension  Mean pulmonary capillary wedge pressure 34 mmHg  Left ventricular end-diastolic pressure 28 mmHg.  Consistent with acute on chronic combined systolic and diastolic heart failure.  Patient is unable to lie flat today for the procedure but was done with him sitting at 50 degrees and breathing high flow oxygen to maintain O2 sats greater than 95% RECOMMENDATIONS:  First order of business is management of volume overload with aggressive diuresis and institution of guideline directed therapy for heart failure.  Whether or not revascularization should be attempted should be debated by the interventional team.  The right coronary is a target but each of 3 stents has significant in-stent restenosis, therefore long-term patency would be questionable.  The circumflex is totally occluded with poor collaterals.  The LAD is anatomically small but without high-grade obstruction.  This anatomy does not seem to be very conducive to surgical revascularization.  Concerned about prognosis in this patient given the anatomic substrate, diffuse in-stent restenosis throughout the right coronary and total occlusion and long stents  placed in the circumflex.  Resume IV heparin.  Early next week, need to determine if PCI is a reasonable revascularization approach after heart failure is controlled.  If PCI is performed, it should be from the femoral approach due to difficulty with catheter engagement of the right coronary.  DG Chest Portable 1 View  Result Date: 10/05/2019 CLINICAL DATA:  Shortness of breath EXAM: PORTABLE CHEST 1 VIEW COMPARISON:  Oct 05, 2019, 1:32 a.m. FINDINGS: Again noted is cardiomegaly. There is prominence of the central pulmonary vasculature. There is new mildly increased interstitial opacity seen within both lower lungs. No large airspace consolidation or pleural effusion. No acute osseous abnormality. IMPRESSION: Cardiomegaly and slight interval worsening in interstitial edema Electronically Signed   By: Jonna Clark M.D.   On: 10/05/2019 03:01   DG Chest Portable 1 View  Result Date: 10/05/2019 CLINICAL DATA:  Chest pain EXAM: PORTABLE CHEST 1 VIEW COMPARISON:  None. FINDINGS: Moderate cardiomegaly. Pulmonary vascular congestion without overt edema. No pleural effusion or pneumothorax. No focal airspace consolidation. IMPRESSION: Cardiomegaly and pulmonary vascular congestion without overt edema. Electronically Signed   By: Deatra Robinson M.D.   On: 10/05/2019 01:46   ECHOCARDIOGRAM COMPLETE  Result Date: 10/05/2019    ECHOCARDIOGRAM REPORT   Patient Name:   ELBY BLACKWELDER Date of Exam: 10/05/2019 Medical Rec #:  [id number]     Height:       68.0 in Accession #:    [id number]    Weight:       264.0 lb Date of Birth:  1955-11-21      BSA:          2.300 m Patient Age:    63 years      BP:           119/87 mmHg Patient Gender: M             HR:           94 bpm. Exam Location:  Inpatient Procedure: 2D Echo, Color Doppler, Cardiac Doppler and Intracardiac            Opacification Agent Indications:    I48.91* Unspecified atrial fibrillation  History:  Patient has prior history of Echocardiogram  examinations, most                 recent 07/22/2018. CAD, Arrythmias:Atrial Fibrillation; Risk                 Factors:Hypertension, Diabetes and Dyslipidemia. Prior performed                 in [place].  Sonographer:    Irving Burton Senior RDCS Referring Phys: [id number] CARRIEL T NIPP  Sonographer Comments: Technically difficult study due to poor echo windows. Performed supine on BiPap. IMPRESSIONS  1. Left ventricular ejection fraction, by estimation, is 30 to 35%. The left ventricle has moderately decreased function. The left ventricle demonstrates global hypokinesis. The left ventricular internal cavity size was mildly dilated. Left ventricular diastolic parameters are indeterminate.  2. Right ventricular systolic function is normal. The right ventricular size is mildly enlarged. There is normal pulmonary artery systolic pressure.  3. Left atrial size was severely dilated.  4. Right atrial size was mildly dilated.  5. The mitral valve is grossly normal. Trivial mitral valve regurgitation. No evidence of mitral stenosis.  6. The aortic valve is grossly normal. Aortic valve regurgitation is not visualized. No aortic stenosis is present.  7. The inferior vena cava is dilated in size with >50% respiratory variability, suggesting right atrial pressure of 8 mmHg. Comparison(s): No access to prior study for comparison. FINDINGS  Left Ventricle: Left ventricular ejection fraction, by estimation, is 30 to 35%. The left ventricle has moderately decreased function. The left ventricle demonstrates global hypokinesis. Definity contrast agent was given IV to delineate the left ventricular endocardial borders. The left ventricular internal cavity size was mildly dilated. There is no left ventricular hypertrophy. Left ventricular diastolic parameters are indeterminate. Right Ventricle: The right ventricular size is mildly enlarged. No increase in right ventricular wall thickness. Right ventricular systolic function is normal. There is  normal pulmonary artery systolic pressure. The tricuspid regurgitant velocity is 2.30  m/s, and with an assumed right atrial pressure of 8 mmHg, the estimated right ventricular systolic pressure is 29.2 mmHg. Left Atrium: Left atrial size was severely dilated. Right Atrium: Right atrial size was mildly dilated. Pericardium: There is no evidence of pericardial effusion. Mitral Valve: The mitral valve is grossly normal. Trivial mitral valve regurgitation. No evidence of mitral valve stenosis. Tricuspid Valve: The tricuspid valve is normal in structure. Tricuspid valve regurgitation is trivial. No evidence of tricuspid stenosis. Aortic Valve: The aortic valve is grossly normal.. There is mild thickening and mild calcification of the aortic valve. Aortic valve regurgitation is not visualized. No aortic stenosis is present. There is mild thickening of the aortic valve. There is mild calcification of the aortic valve. Pulmonic Valve: The pulmonic valve was not well visualized. Pulmonic valve regurgitation is not visualized. No evidence of pulmonic stenosis. Aorta: The aortic root and ascending aorta are structurally normal, with no evidence of dilitation. Venous: The inferior vena cava is dilated in size with greater than 50% respiratory variability, suggesting right atrial pressure of 8 mmHg. IAS/Shunts: No atrial level shunt detected by color flow Doppler.  LEFT VENTRICLE PLAX 2D LVIDd:         6.00 cm LVIDs:         5.10 cm LV PW:         0.80 cm LV IVS:        0.70 cm LVOT diam:     2.10 cm LV SV:  47 LV SV Index:   20 LVOT Area:     3.46 cm  RIGHT VENTRICLE RV S prime:     10.10 cm/s TAPSE (M-mode): 1.8 cm LEFT ATRIUM              Index       RIGHT ATRIUM           Index LA diam:        5.00 cm  2.17 cm/m  RA Area:     21.40 cm LA Vol (A2C):   97.1 ml  42.22 ml/m RA Volume:   63.10 ml  27.44 ml/m LA Vol (A4C):   131.5 ml 57.18 ml/m LA Biplane Vol: 125.0 ml 54.35 ml/m  AORTIC VALVE LVOT Vmax:   76.70  cm/s LVOT Vmean:  51.600 cm/s LVOT VTI:    0.136 m  AORTA Ao Root diam: 3.20 cm Ao Asc diam:  3.60 cm TRICUSPID VALVE TR Peak grad:   21.2 mmHg TR Vmax:        230.00 cm/s  SHUNTS Systemic VTI:  0.14 m Systemic Diam: 2.10 cm Jodelle RedBridgette Christopher MD Electronically signed by Jodelle RedBridgette Christopher MD Signature Date/Time: 10/05/2019/11:32:01 AM    Final    Disposition   Patient is being discharged home today in good condition.  Follow-up Plans & Appointments    Follow-up Information    Corrin ParkerGoodrich, Callie E, PA-C Follow up.   Specialties: Physician Assistant, Cardiology Why: Follow-up scheduled for Friday 11/17/2019 at 9:45am. Please arrive 15 minutes early for check-in. If this date/time does not work for you, please call our office to reschedule.  Contact information: [address] [place] [id number] [phone]        Cardiology Follow up.   Why: Please call you [place] in [place] as soon as possible to schedule a hospital follow-up visit within the next 1-2 weeks. You need to have labs (BMP) checked at that time.         Discharge Instructions    (HEART FAILURE PATIENTS) Call MD:  Anytime you have any of the following symptoms: 1) 3 pound weight gain in 24 hours or 5 pounds in 1 week 2) shortness of breath, with or without a dry hacking cough 3) swelling in the hands, feet or stomach 4) if you have to sleep on extra pillows at night in order to breathe.   Complete by: As directed    (HEART FAILURE PATIENTS) Call MD:  Anytime you have any of the following symptoms: 1) 3 pound weight gain in 24 hours or 5 pounds in 1 week 2) shortness of breath, with or without a dry hacking cough 3) swelling in the hands, feet or stomach 4) if you have to sleep on extra pillows at night in order to breathe.   Complete by: As directed    Amb Referral to Cardiac Rehabilitation   Complete by: As directed    Diagnosis:  Coronary Stents NSTEMI     After initial evaluation and  assessments completed: Virtual Based Care may be provided alone or in conjunction with Phase 2 Cardiac Rehab based on patient barriers.: Yes   Call MD for:  redness, tenderness, or signs of infection (pain, swelling, redness, odor or green/yellow discharge around incision site)   Complete by: As directed    Call MD for:  redness, tenderness, or signs of infection (pain, swelling, redness, odor or green/yellow discharge around incision site)   Complete by: As directed    Call MD  for:  severe uncontrolled pain   Complete by: As directed    Call MD for:  severe uncontrolled pain   Complete by: As directed    Diet - low sodium heart healthy   Complete by: As directed    Increase activity slowly   Complete by: As directed    Increase activity slowly   Complete by: As directed       Discharge Medications   Allergies as of 10/11/2019   No Known Allergies     Medication List    STOP taking these medications   amLODipine 10 MG tablet Commonly known as: NORVASC   lisinopril 40 MG tablet Commonly known as: ZESTRIL     TAKE these medications   amiodarone 200 MG tablet Commonly known as: PACERONE Take 1 tablet (200mg ) two times daily for 2 weeks. Then on 10/24/2019, switch to 1 tablet (200mg ) one time daily.   aspirin 81 MG EC tablet Take 81 mg by mouth daily.   atorvastatin 80 MG tablet Commonly known as: LIPITOR Take 80 mg by mouth every evening.   clopidogrel 75 MG tablet Commonly known as: PLAVIX Take 1 tablet (75 mg total) by mouth daily with breakfast.   Eliquis 5 MG Tabs tablet Generic drug: apixaban Take 5 mg by mouth in the morning and at bedtime.   Farxiga 5 MG Tabs tablet Generic drug: dapagliflozin propanediol Take 5 mg by mouth daily.   furosemide 40 MG tablet Commonly known as: LASIX Take 1 tablet (40 mg total) by mouth daily.   losartan 25 MG tablet Commonly known as: COZAAR Take 1 tablet (25 mg total) by mouth daily.   metoprolol succinate 50 MG 24 hr  tablet Commonly known as: TOPROL-XL Take 50 mg by mouth daily.   tamsulosin 0.4 MG Caps capsule Commonly known as: FLOMAX Take 0.4 mg by mouth daily.   Trulicity 2.56 LS/9.3TD Sopn Generic drug: Dulaglutide Inject 0.75 mg into the skin every Monday.          Outstanding Labs/Studies   Repeat BMET at follow-up visit in 1-2 weeks.  Duration of Discharge Encounter   Greater than 30 minutes including physician time.  Signed, Darreld Mclean, PA-C 10/11/2019, 11:46 AM

## 2019-10-11 NOTE — Progress Notes (Deleted)
Patient has now spontaneously voided .

## 2019-10-11 NOTE — Discharge Instructions (Signed)
Medication Changes: - You will be on Aspirin 81mg  daily, Plavix 75mg  daily, and Eliquis 5mg  twice daily for 1 month. After 1 month, the Aspirin can be stopped but you should continue the Plavix and Eliquis. - START Amiodarone 200mg  twice daily for 2 weeks. Then on 10/24/2019, switch to 200mg  once daily. - START Lasix 40mg  daily and K-Dur 20 mEq daily. - CONTINUE Metoprolol succinate 50mg  daily. - STOP Lisinopril and START Losartan 25mg  daily. - STOP Amlodipine.  Continue all other medications as directed elsewhere on discharge paperwork.  Post NSTEMI: NO HEAVY LIFTING X 2 WEEKS. NO SEXUAL ACTIVITY X 2 WEEKS. NO SOAKING BATHS, HOT TUBS, POOLS, ETC., X 7 DAYS.  Radial Site Care: Refer to this sheet in the next few weeks. These instructions provide you with information on caring for yourself after your procedure. Your caregiver may also give you more specific instructions. Your treatment has been planned according to current medical practices, but problems sometimes occur. Call your caregiver if you have any problems or questions after your procedure. HOME CARE INSTRUCTIONS  You may shower the day after the procedure.Remove the bandage (dressing) and gently wash the site with plain soap and water.Gently pat the site dry.   Do not apply powder or lotion to the site.   Do not submerge the affected site in water for 3 to 5 days.   Inspect the site at least twice daily.   Do not flex or bend the affected arm for 24 hours.   No lifting over 5 pounds (2.3 kg) for 5 days after your procedure.   Do not drive home if you are discharged the same day of the procedure. Have someone else drive you.  What to expect:  Any bruising will usually fade within 1 to 2 weeks.   Blood that collects in the tissue (hematoma) may be painful to the touch. It should usually decrease in size and tenderness within 1 to 2 weeks.  SEEK IMMEDIATE MEDICAL CARE IF:  You have unusual pain at the radial site.   You  have redness, warmth, swelling, or pain at the radial site.   You have drainage (other than a small amount of blood on the dressing).   You have chills.   You have a fever or persistent symptoms for more than 72 hours.   You have a fever and your symptoms suddenly get worse.   Your arm becomes pale, cool, tingly, or numb.   You have heavy bleeding from the site. Hold pressure on the site.   Heart Failure Education: 1. Weigh yourself EVERY morning after you go to the bathroom but before you eat or drink anything. Write this number down in a weight log/diary. If you gain 3 pounds overnight or 5 pounds in a week, call the office. 2. Take your medicines as prescribed. If you have concerns about your medications, please call before you stop taking them.  3. Eat low salt foods--Limit salt (sodium) to 2000 mg per day. This will help prevent your body from holding onto fluid. Read food labels as many processed foods have a lot of sodium, especially canned goods and prepackaged meats. If you would like some assistance choosing low sodium foods, we would be happy to set you up with a nutritionist. 4. Stay as active as you can everyday. Staying active will give you more energy and make your muscles stronger. Start with 5 minutes at a time and work your way up to 30 minutes a day.  Break up your activities--do some in the morning and some in the afternoon. Start with 3 days per week and work your way up to 5 days as you can.  If you have chest pain, feel short of breath, dizzy, or lightheaded, STOP. If you don't feel better after a short rest, call 911. If you do feel better, call the office to let us know you have symptoms with exercise. 5. Limit all fluids for the day to less than 2 liters. Fluid includes all drinks, coffee, juice, ice chips, soup, jello, and all other liquids.

## 2019-10-11 NOTE — Progress Notes (Deleted)
Patient voided large amount of urine in the bed requiring total bed change, incontinent care provided.  Bladder scan completed 286-316mL.  RN spoke with Dr. Antionette Char on call for [facility] and order received to delay in and out catheterization to see if patient will continue to void spontaneously.  PrimoFit placed.

## 2019-10-11 NOTE — Progress Notes (Signed)
CARDIAC REHAB PHASE I   PRE:  Rate/Rhythm: 80 afib  BP:  Supine:   Sitting: 117/77  Standing:    SaO2: 95%RA  MODE:  Ambulation: 800 ft   POST:  Rate/Rhythm: 97 afib  BP:  Supine:   Sitting: 150/100, 145/95  Standing:    SaO2: 96%RA 0825-0850 Pt walked 800 ft with steady gait and no CP. Tolerated well. No questions re ed done yesterday.   Luetta Nutting, RN BSN  10/11/2019 8:46 AM

## 2019-10-12 ENCOUNTER — Telehealth (HOSPITAL_COMMUNITY): Payer: Self-pay

## 2019-10-12 NOTE — Telephone Encounter (Signed)
Pt insurance is active and benefits verified through [place]. Co-pay $0.00, DED $1,000.00/$1,000.00 met, out of pocket $5,000.00/$2,320.99 met, co-insurance 10%. No pre-authorization required. [place]/[employer], 10/12/19 @ 12:16PM, TTS#[id number]  Will contact patient to see if he is interested in the Cardiac Rehab Program. If interested, patient will need to complete follow up appt. Once completed, patient will be contacted for scheduling upon review by the RN Navigator.

## 2019-10-12 NOTE — Telephone Encounter (Signed)
Attempted to call patient in regards to Cardiac Rehab - LM on VM 

## 2019-10-12 NOTE — Telephone Encounter (Signed)
Pt returned CR phone call in regards to CR, patient expressed interest. Explained scheduling process and went over insurance, patient verbalized understanding. Will contact patient for scheduling once f/u has been completed.

## 2019-10-19 ENCOUNTER — Telehealth (HOSPITAL_COMMUNITY): Payer: Self-pay

## 2019-10-19 NOTE — Telephone Encounter (Signed)
Heart Failure Stewardship Pharmacist  Transitions of Care Follow-up Call   PCP: System, Pcp Not In PCP-Cardiologist: Olga Millers, MD    HPI and Hospital Course:  64 yo M with PMH significant for CAD (s/p PCI of RCA and LCx), diabetes mellitus, and atrial fibrillation admitted on 5/13 for afib with RVR, acute pulmonary edema, and chest pain. He was then cardioverted x 2 in the ED to sinus rhythm with improved ventricular rate. ECHO on 10/05/19 showed newly reduced LV dysfunction - LVEF 30-35%. Right and left heart catheterization completed 10/06/19. Found to have severe native vessel disease with moderate distal in-stent restenosis in RCA. LCx occluded with poor collaterals. No interventions/PCI completed pending further discussion with interventional +/- surgical team. Moderate pulmonary hypertension: RA 15, PA 44, PCWP 34, LVEDP 27, CO/CI 4.32/1.88. S/p LHC on 5/17 - PCI with DES x 2 to mid and dist RCA and POBA of PDA restenosis. He received IV diuretics throughout hospitalization and lost 21 lb of fluid. He was then discharged from [facility] on 10/11/19.   *Patient was unable to be reached after 3 call attempts*  Pertinent Lab Values: . Serum creatinine 1.26, BUN 24, Potassium 3.5, Sodium 136, BNP 428.3, Magnesium 2.3  HF Medications: Furosemide 40 mg daily Metoprolol succinate 50 mg daily Losartan 25 mg daily Farxiga 5 mg daily   Medication Assistance / Insurance Benefits Check: Does the patient have prescription insurance?   yes Type of insurance plan: commercial - [employer]  Does the patient qualify for medication assistance through manufacturers or grants?   yes  Eligible grants and/or patient assistance programs: pending HF regimen selection  Medication assistance applications in progress: None  Medication assistance applications approved: None  Approved medication assistance renewals will be completed by: Dr. Ludwig Clarks office   Assessment: 1. Acutesystolic CHF (EF  30-35%), ischemic in etiology. L/RHC completed 5/14 with severe native vessel disease, in-stent restenosis, and moderate pulmonary hypertension. PCI x 2 to RCA and POBA to PDA restenosis. NYHA class IIsymptoms. - Continue furosemide 40 mg PO daily - Continue metoprolol succinate 50 mg daily - Continue losartan 25 mg daily. Consider starting Entresto 24/26 mg BID pending BP tolerability - Consider adding spironolactone at hospital follow up appointment  - Continue Farxiga 5 mg daily - consider increasing to 10 mg for HF indication.   Plan: 1) Medication changes recommended at this time: - Consider switching losartan to Entresto 24/26 mg BID pending BP tolerability - Consider increasing Farxiga to 10 mg daily for HF indication  2) Patient assistance application(s): - None - Entresto would be $40 for 30 days or $100 for 90 days through [employer]. Provided with 30-day free card and copay card to bring copay down to $10 per month.  3) Follow up - 11/17/19 with Marjie Skiff, PA-C  Danae Orleans, PharmD, BCPS HF Stewardship Pharmacist Phone [phone] 10/19/2019       11:41 AM

## 2019-11-06 NOTE — Progress Notes (Deleted)
Cardiology Office Note:    Date:  11/06/2019   ID:  Carl Little, DOB 10/03/1955, MRN [id number]  PCP:  System, Pcp Not In  Cardiologist:  Carl Ruths, MD  Electrophysiologist:  None   Referring MD: No ref. provider found   Chief Complaint: ***  History of Present Illness:    Carl Little is a 64 y.o. male with a history of CAD s/p prior DES x3 to the RCA and DES x2 to LCX in 2008, hypertension, persistent atrial fibrillation on Eliquis, type 2 diabetes mellitus, CKD stage III, and obesity who presents for hospital follow-up.  Patient is from [place], [place] but is working in [place] for a few years. He is followed by a Cardiologist (Dr. Andres Shad) in [place]. Notes are available in Care Everywhere. He has known CAD with prior DES x3 to RCA and DES x2 to LCX in 2008. He also has known persistent atrial fibrillation on Eliquis. Patient recently admitted from 10/05/2019 to 10/11/2019 with NSTEMI, acute systolic CHF, and atrial fibrillation with RVR. He presented with shortness of breath and mild substernal chest pain and was found to be in atrial fibrillation with RVR on arrival. He underwent emergent cardioversion in the ED with conversion to normal sinus rhythm. High sensitivity troponin peaked at 24,409. He had right/left heart cath on 10/06/2019 with showed severe native CAD with occluded OM and multiple lesions in the previously stented RCA. He was noted to have LVEDP of 28 mmHg, mean pulmonary capillary wedge pressure of 34 mmHg, and moderate to severe pulmonary hypertension. She was unable to lie flat during the procedure, so no intervention was performed and patient was started on aggressive diuresis for management of volume overload. Unfortunately he went back into atrial fibrillation following cath and was started on Amiodarone. He returned to the cath lab on 4/17 after being diuresed and underwent successful PCI with DES x2 to the mid and distal RCA and POBA of the PDA restenosis.  Patient started on DAPT and GDMT for CHF. He was diuresed 22 lbs during admission and discharge weight was 242 lbs. He was discharged on Aspirin 81mg  daily, Plavix 75mg  daily, Amiodarone 200mg  twice daily x2 weeks and then 200mg  daily, Lasix 40mg  daily (with K-Dur 20 mEq daily), Losartan 25mg  daily, Toprol-50mg  daily, and Eliquis 5mg  twice daily.  Following discharged, he traveled back up to [facility] and was see by Dr. Tanna Savoy office on 10/19/2019. At that time, he reported doing great since discharge. Losartan was stopped and he was started on Entresto 24-26mg  twice daily. Amiodarone was also stopped.   Patient presents today for follow-up. ***  CAD s/p Recent NSTEMI - History of DES x3 to RCA and DES x2 to LCX in 2008. Recently admitted with NSTEMI and underwent PCI with DES x2 to the mid and distal RCA and POBA of the PDA restenosis on 10/09/2019.  - Stable. No recurrent angina. *** - Continue Plavix 75mg  daily. Aspirin discontinued 1 month after PCI given patient is also on Eliquis. *** - Continue beta-blocker and high-intensity statin.   Persistent Atrial Fibrillation - Rates ***. - Amiodarone stopped at follow-up visit with primary Cardiologist in [place].  - Continue Toprol-XL ***. - Continue chronic anticoagulation with Eliquis 5mg  twice daily.  Chronic Systolic CHF - Newly diagnosed during recent hospitalization.  - Echo showed LVEF of 30-35% with global hypokinesis. RV mildly enlarged but systolic function and PASP normal. - *** - Lasix *** - Continue Entresto 24-46mg  twice daily and Toprol-XL 50mg  daily. *** -  Patient scheduled to have repeat Echo when back in [place] next month.   Hypertension  Dyslipidemia - Lipid panel on 10/05/2019: Total Cholesterol 101, Triglycerides 49, HDL 33, LDL 58.  - At LDL goal of <70 given CAD. - Continue Lipitor 80mg  daily.  Type 2 Diabetes Mellitus - Hemoglobin A1c 7.6 on 10/05/2019.  - On Farxiga and Dulaglutide.  - Managed by PCP.  CKD  Stage III - ***    Past Medical History:  Diagnosis Date  . Atrial fibrillation (HCC) 09/2019  . CAD (coronary artery disease)   . Diabetes mellitus without complication [facility])     Past Surgical History:  Procedure Laterality Date  . CARDIAC CATHETERIZATION    . CORONARY BALLOON ANGIOPLASTY N/A 10/09/2019   Procedure: CORONARY BALLOON ANGIOPLASTY;  Surgeon: 10/11/2019, Peter M, MD;  Location: [facility] INVASIVE CV LAB;  Service: Cardiovascular;  Laterality: N/A;  Right PDA  . CORONARY STENT INTERVENTION  2008  . CORONARY STENT INTERVENTION N/A 10/09/2019   Procedure: CORONARY STENT INTERVENTION;  Surgeon: 10/11/2019, Peter M, MD;  Location: [facility] INVASIVE CV LAB;  Service: Cardiovascular;  Laterality: N/A;  Mid RCA  . RIGHT/LEFT HEART CATH AND CORONARY ANGIOGRAPHY N/A 10/06/2019   Procedure: RIGHT/LEFT HEART CATH AND CORONARY ANGIOGRAPHY;  Surgeon: 10/08/2019, MD;  Location: [facility] INVASIVE CV LAB;  Service: Cardiovascular;  Laterality: N/A;    Current Medications: No outpatient medications have been marked as taking for the 11/17/19 encounter (Appointment) with 11/19/19, PA-C.     Allergies:   Patient has no known allergies.   Social History   Socioeconomic History  . Marital status: Single    Spouse name: Not on file  . Number of children: Not on file  . Years of education: Not on file  . Highest education level: Not on file  Occupational History  . Not on file  Tobacco Use  . Smoking status: Never Smoker  . Smokeless tobacco: Never Used  Vaping Use  . Vaping Use: Never used  Substance and Sexual Activity  . Alcohol use: Yes    Comment: occasional  . Drug use: Never  . Sexual activity: Not on file  Other Topics Concern  . Not on file  Social History Narrative  . Not on file   Social Determinants of Health   Financial Resource Strain:   . Difficulty of Paying Living Expenses:   Food Insecurity:   . Worried About Corrin Parker in the Last Year:   . Programme researcher, broadcasting/film/video in the Last Year:   Transportation Needs:   . Engineer, site (Medical):   Freight forwarder Lack of Transportation (Non-Medical):   Physical Activity:   . Days of Exercise per Week:   . Minutes of Exercise per Session:   Stress:   . Feeling of Stress :   Social Connections:   . Frequency of Communication with Friends and Family:   . Frequency of Social Gatherings with Friends and Family:   . Attends Religious Services:   . Active Member of Clubs or Organizations:   . Attends [employer] Meetings:   Banker Marital Status:      Family History: The patient's ***family history is not on file.  ROS:   Please see the history of present illness.    *** All other systems reviewed and are negative.  EKGs/Labs/Other Studies Reviewed:    The following studies were reviewed today:  Echocardiogram 10/05/2019: Impressions: 1. Left ventricular ejection fraction, by estimation, is  30 to 35%. The  left ventricle has moderately decreased function. The left ventricle  demonstrates global hypokinesis. The left ventricular internal cavity size  was mildly dilated. Left ventricular  diastolic parameters are indeterminate.  2. Right ventricular systolic function is normal. The right ventricular  size is mildly enlarged. There is normal pulmonary artery systolic  pressure.  3. Left atrial size was severely dilated.  4. Right atrial size was mildly dilated.  5. The mitral valve is grossly normal. Trivial mitral valve  regurgitation. No evidence of mitral stenosis.  6. The aortic valve is grossly normal. Aortic valve regurgitation is not  visualized. No aortic stenosis is present.  7. The inferior vena cava is dilated in size with >50% respiratory  variability, suggesting right atrial pressure of 8 mmHg. _______________  Right/Left Cardiac Catheterization 10/06/2019:  Severe native vessel coronary disease with anatomically small left anterior descending and the anterior interventricular  groove with much of the anterior wall territory supplied by a large branching diagonal.  Right dominant anatomy with the right coronary containing a proximal stent, mid to distal stent, and PDA stent.  Moderate distal in-stent restenosis involving the proximal segment, the nonstented mid segment contains diffuse 60% narrowing with 99% proximal margin in-stent restenosis, and 99% in-stent restenosis of the PDA stent.  The PDA does reach the left ventricular apex but was difficult to completely visualize due to respiratory disengagement of the diagnostic catheter.  Multiple catheters were used.  Circumflex contains a long stented segment from the mid circumflex into a large second obtuse marginal that is totally occluded.  A smaller second obtuse marginal is jailed by the stent and contains ostial 99% stenosis with slow flow.  Vessel diameter is small.  The larger stented branch fills faintly by left to left collaterals.  The LAD contains no significant obstruction before the origin of the large diagonal.  The diagonal contains perhaps 30 to 40% ostial narrowing.  The LAD beyond the origin of the diagonal contains 80% stenosis.  The LAD proper is small.  Moderate to severe pulmonary hypertension  Mean pulmonary capillary wedge pressure 34 mmHg  Left ventricular end-diastolic pressure 28 mmHg.  Consistent with acute on chronic combined systolic and diastolic heart failure.  Patient is unable to lie flat today for the procedure but was done with him sitting at 50 degrees and breathing high flow oxygen to maintain O2 sats greater than 95%  Recommendations:  First order of business is management of volume overload with aggressive diuresis and institution of guideline directed therapy for heart failure.  Whether or not revascularization should be attempted should be debated by the interventional team.  The right coronary is a target but each of 3 stents has significant in-stent restenosis, therefore  long-term patency would be questionable.  The circumflex is totally occluded with poor collaterals.  The LAD is anatomically small but without high-grade obstruction.  This anatomy does not seem to be very conducive to surgical revascularization.  Concerned about prognosis in this patient given the anatomic substrate, diffuse in-stent restenosis throughout the right coronary and total occlusion and long stents placed in the circumflex.  Resume IV heparin.  Early next week, need to determine if PCI is a reasonable revascularization approach after heart failure is controlled.  If PCI is performed, it should be from the femoral approach due to difficulty with catheter engagement of the right coronary. _______________  Coronary Stent Intervention 10/09/2019:  Mid LAD lesion is 65% stenosed.  1st Diag lesion is 40%  stenosed.  2nd Mrg lesion is 100% stenosed.  Mid Cx to Dist Cx lesion is 99% stenosed.  RPDA lesion is 80% stenosed.  Mid RCA lesion is 95% stenosed.  Dist RCA lesion is 50% stenosed.  Prox RCA to Mid RCA lesion is 65% stenosed.  Ost RCA lesion is 50% stenosed.  Post intervention, there is a 10% residual stenosis.  Balloon angioplasty was performed using a BALLOON EMERGE MR 2.5X12.  A drug-eluting stent was successfully placed using a STENT RESOLUTE ONYX [id number].  Post intervention, there is a 0% residual stenosis.  A drug-eluting stent was successfully placed using a STENT RESOLUTE [id number].  Post intervention, there is a 0% residual stenosis.  Post intervention, there is a 0% residual stenosis.   1. Successful PCI of the RCA including POBA of the PDA restenosis, stenting of the distal RCA with 2.75 x 22 mm Resolute Onyx, stenting of the mid RCA with 3.0 x 38 mm Resolute Onyx stent using IVUS guidance.  Plan: DAPT - ASA 81 mg daily for one month, Plavix 75 mg daily for at least 12 months. May initiate anticoagulation with DOAC tomorrow.    EKG:  EKG ordered  today. EKG personally reviewed and demonstrates ***.  Recent Labs: 10/05/2019: ALT 99; B Natriuretic Peptide 428.3; TSH 4.307 10/10/2019: Hemoglobin 13.7; Magnesium 2.3; Platelets 162 10/11/2019: BUN 24; Creatinine, Ser 1.26; Potassium 3.5; Sodium 136  Recent Lipid Panel    Component Value Date/Time   CHOL 101 10/05/2019 0108   TRIG 49 10/05/2019 0108   HDL 33 (L) 10/05/2019 0108   CHOLHDL 3.1 10/05/2019 0108   VLDL 10 10/05/2019 0108   LDLCALC 58 10/05/2019 0108    Physical Exam:    Vital Signs: There were no vitals taken for this visit.    Wt Readings from Last 3 Encounters:  10/11/19 242 lb 6.4 oz (110 kg)     General: 64 y.o. male in no acute distress. HEENT: Normocephalic and atraumatic. Sclera clear. EOMs intact. Neck: Supple. No carotid bruits. No JVD. Heart: *** RRR. Distinct S1 and S2. No murmurs, gallops, or rubs. Radial and distal pedal pulses 2+ and equal bilaterally. Lungs: No increased work of breathing. Clear to ausculation bilaterally. No wheezes, rhonchi, or rales.  Abdomen: Soft, non-distended, and non-tender to palpation. Bowel sounds present in all 4 quadrants.  MSK: Normal strength and tone for age. *** Extremities: No lower extremity edema.    Skin: Warm and dry. Neuro: Alert and oriented x3. No focal deficits. Psych: Normal affect. Responds appropriately.   Assessment:    No diagnosis found.  Plan:     Disposition: Follow up in ***   Medication Adjustments/Labs and Tests Ordered: Current medicines are reviewed at length with the patient today.  Concerns regarding medicines are outlined above.  No orders of the defined types were placed in this encounter.  No orders of the defined types were placed in this encounter.   There are no Patient Instructions on file for this visit.   Signed, Corrin Parker, PA-C  11/06/2019 2:02 PM     [facility]

## 2019-11-17 ENCOUNTER — Ambulatory Visit: Payer: Managed Care, Other (non HMO) | Admitting: Student

## 2020-12-28 IMAGING — DX DG CHEST 1V PORT
1 series · 1 of 1 positions shown · non-contrast
Comparison: None.

CLINICAL DATA: Chest pain

EXAM:
PORTABLE CHEST 1 VIEW

[chest ap]
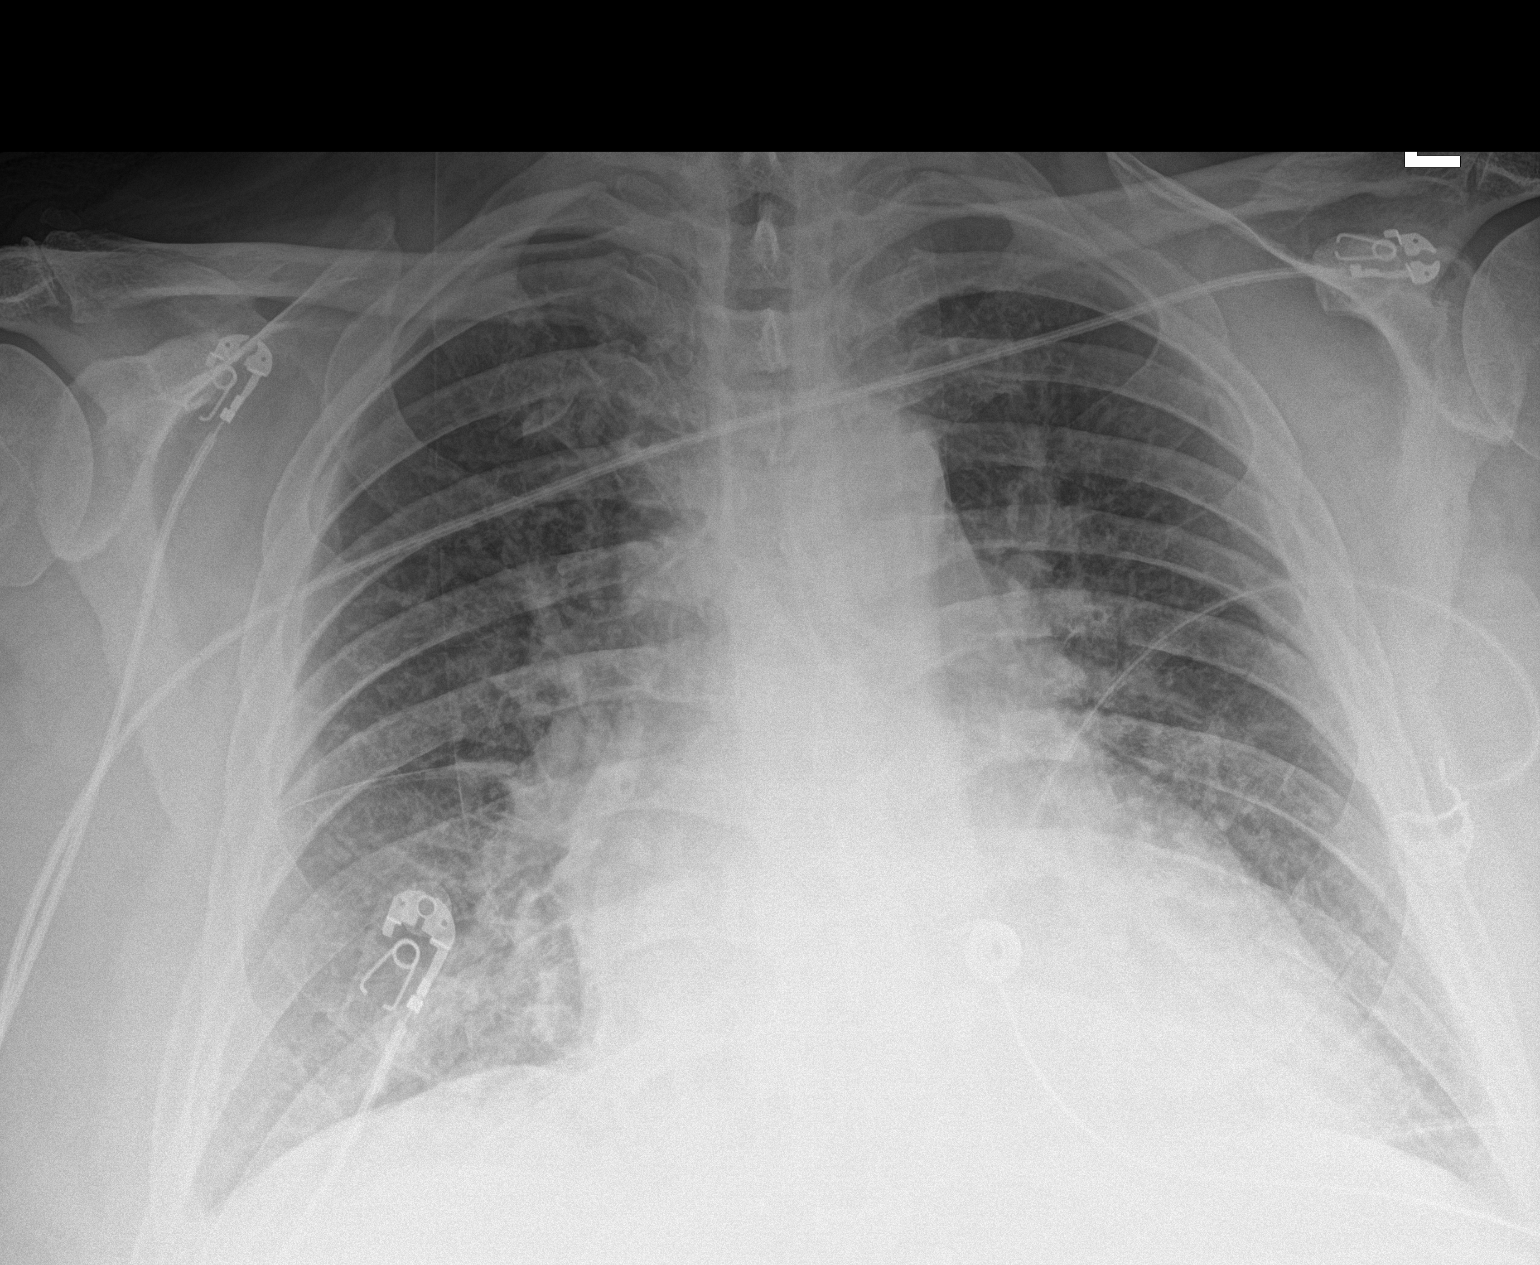

[1 of 1 positions shown; findings below may reference images not displayed]

FINDINGS: Moderate cardiomegaly. Pulmonary vascular congestion without overt
edema. No pleural effusion or pneumothorax. No focal airspace
consolidation.
IMPRESSION: Cardiomegaly and pulmonary vascular congestion without overt edema.

## 2021-01-18 ENCOUNTER — Encounter (HOSPITAL_BASED_OUTPATIENT_CLINIC_OR_DEPARTMENT_OTHER): Payer: Self-pay | Admitting: Emergency Medicine

## 2021-01-18 ENCOUNTER — Other Ambulatory Visit: Payer: Self-pay

## 2021-01-18 ENCOUNTER — Emergency Department (HOSPITAL_BASED_OUTPATIENT_CLINIC_OR_DEPARTMENT_OTHER)
Admission: EM | Admit: 2021-01-18 | Discharge: 2021-01-18 | Disposition: A | Discharge status: Home / Self Care | Payer: Managed Care, Other (non HMO) | Attending: Emergency Medicine | Admitting: Emergency Medicine

## 2021-01-18 DIAGNOSIS — Z7902 Long term (current) use of antithrombotics/antiplatelets: Secondary | ICD-10-CM | POA: Insufficient documentation

## 2021-01-18 DIAGNOSIS — I5021 Acute systolic (congestive) heart failure: Secondary | ICD-10-CM | POA: Diagnosis not present

## 2021-01-18 DIAGNOSIS — N183 Chronic kidney disease, stage 3 unspecified: Secondary | ICD-10-CM | POA: Insufficient documentation

## 2021-01-18 DIAGNOSIS — I2510 Atherosclerotic heart disease of native coronary artery without angina pectoris: Secondary | ICD-10-CM | POA: Diagnosis not present

## 2021-01-18 DIAGNOSIS — E1122 Type 2 diabetes mellitus with diabetic chronic kidney disease: Secondary | ICD-10-CM | POA: Insufficient documentation

## 2021-01-18 DIAGNOSIS — I130 Hypertensive heart and chronic kidney disease with heart failure and stage 1 through stage 4 chronic kidney disease, or unspecified chronic kidney disease: Secondary | ICD-10-CM | POA: Insufficient documentation

## 2021-01-18 DIAGNOSIS — S8012XA Contusion of left lower leg, initial encounter: Secondary | ICD-10-CM | POA: Diagnosis not present

## 2021-01-18 DIAGNOSIS — W06XXXA Fall from bed, initial encounter: Secondary | ICD-10-CM | POA: Diagnosis not present

## 2021-01-18 DIAGNOSIS — Z79899 Other long term (current) drug therapy: Secondary | ICD-10-CM | POA: Diagnosis not present

## 2021-01-18 DIAGNOSIS — Z794 Long term (current) use of insulin: Secondary | ICD-10-CM | POA: Insufficient documentation

## 2021-01-18 DIAGNOSIS — Z7901 Long term (current) use of anticoagulants: Secondary | ICD-10-CM | POA: Diagnosis not present

## 2021-01-18 DIAGNOSIS — Z7984 Long term (current) use of oral hypoglycemic drugs: Secondary | ICD-10-CM | POA: Insufficient documentation

## 2021-01-18 DIAGNOSIS — Z7982 Long term (current) use of aspirin: Secondary | ICD-10-CM | POA: Diagnosis not present

## 2021-01-18 DIAGNOSIS — S8992XA Unspecified injury of left lower leg, initial encounter: Secondary | ICD-10-CM | POA: Diagnosis present

## 2021-01-18 HISTORY — DX: Acute myocardial infarction, unspecified: I21.9

## 2021-01-18 NOTE — ED Triage Notes (Addendum)
Pt reports bruising and swelling to left leg x 1 week. After falling out the bed and hitting leg on dehumidifier.
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
